# Patient Record
Sex: Male | Born: 1965 | Race: White | Hispanic: No | Marital: Single | Smoking: Never smoker
Health system: Southern US, Community
[De-identification: ages and names within clinical notes are randomized; demographics above are authoritative.]

## PROBLEM LIST (undated history)

## (undated) DIAGNOSIS — F70 Mild intellectual disabilities: Secondary | ICD-10-CM

## (undated) DIAGNOSIS — L219 Seborrheic dermatitis, unspecified: Secondary | ICD-10-CM

## (undated) DIAGNOSIS — G808 Other cerebral palsy: Secondary | ICD-10-CM

## (undated) DIAGNOSIS — M1990 Unspecified osteoarthritis, unspecified site: Secondary | ICD-10-CM

## (undated) HISTORY — DX: Mild intellectual disabilities: F70

## (undated) HISTORY — DX: Unspecified osteoarthritis, unspecified site: M19.90

## (undated) HISTORY — DX: Other cerebral palsy: G80.8

## (undated) HISTORY — DX: Seborrheic dermatitis, unspecified: L21.9

---

## 2003-02-17 ENCOUNTER — Encounter: Payer: Self-pay | Admitting: Internal Medicine

## 2003-02-17 ENCOUNTER — Encounter: Admission: RE | Admit: 2003-02-17 | Discharge: 2003-02-17 | Payer: Self-pay | Admitting: Internal Medicine

## 2003-07-05 ENCOUNTER — Encounter: Admission: RE | Admit: 2003-07-05 | Discharge: 2003-07-05 | Payer: Self-pay | Admitting: Internal Medicine

## 2003-07-05 ENCOUNTER — Encounter: Payer: Self-pay | Admitting: Internal Medicine

## 2003-09-08 ENCOUNTER — Encounter: Admission: RE | Admit: 2003-09-08 | Discharge: 2003-12-07 | Payer: Self-pay | Admitting: Internal Medicine

## 2004-09-05 ENCOUNTER — Encounter: Admission: RE | Admit: 2004-09-05 | Discharge: 2004-10-03 | Payer: Self-pay | Admitting: Internal Medicine

## 2005-10-14 ENCOUNTER — Encounter: Admission: RE | Admit: 2005-10-14 | Discharge: 2006-01-12 | Payer: Self-pay | Admitting: Internal Medicine

## 2006-01-13 ENCOUNTER — Encounter: Admission: RE | Admit: 2006-01-13 | Discharge: 2006-02-09 | Payer: Self-pay | Admitting: Internal Medicine

## 2006-02-10 ENCOUNTER — Encounter: Admission: RE | Admit: 2006-02-10 | Discharge: 2006-05-11 | Payer: Self-pay | Admitting: Internal Medicine

## 2007-07-13 ENCOUNTER — Encounter: Admission: RE | Admit: 2007-07-13 | Discharge: 2007-10-11 | Payer: Self-pay | Admitting: Neurosurgery

## 2007-09-16 ENCOUNTER — Encounter: Admission: RE | Admit: 2007-09-16 | Discharge: 2007-09-16 | Payer: Self-pay | Admitting: Internal Medicine

## 2009-05-01 ENCOUNTER — Encounter: Admission: RE | Admit: 2009-05-01 | Discharge: 2009-05-16 | Payer: Self-pay | Admitting: Internal Medicine

## 2009-07-13 ENCOUNTER — Ambulatory Visit: Payer: Self-pay | Admitting: Internal Medicine

## 2009-09-04 ENCOUNTER — Ambulatory Visit: Payer: Self-pay | Admitting: Internal Medicine

## 2009-09-14 ENCOUNTER — Encounter: Admission: RE | Admit: 2009-09-14 | Discharge: 2009-09-14 | Payer: Self-pay | Admitting: Internal Medicine

## 2009-09-19 ENCOUNTER — Ambulatory Visit: Payer: Self-pay | Admitting: Internal Medicine

## 2009-11-14 ENCOUNTER — Ambulatory Visit: Payer: Self-pay | Admitting: Internal Medicine

## 2010-01-18 ENCOUNTER — Ambulatory Visit: Payer: Self-pay | Admitting: Internal Medicine

## 2010-05-10 ENCOUNTER — Ambulatory Visit: Payer: Self-pay | Admitting: Internal Medicine

## 2010-05-31 ENCOUNTER — Ambulatory Visit: Payer: Self-pay | Admitting: Internal Medicine

## 2010-06-05 ENCOUNTER — Encounter: Admission: RE | Admit: 2010-06-05 | Discharge: 2010-06-05 | Payer: Self-pay | Admitting: Internal Medicine

## 2010-06-05 ENCOUNTER — Ambulatory Visit: Payer: Self-pay | Admitting: Internal Medicine

## 2010-06-26 ENCOUNTER — Ambulatory Visit: Payer: Self-pay | Admitting: Internal Medicine

## 2010-07-11 ENCOUNTER — Ambulatory Visit: Payer: Self-pay | Admitting: Internal Medicine

## 2010-09-27 ENCOUNTER — Ambulatory Visit: Payer: Self-pay | Admitting: Internal Medicine

## 2011-06-12 DIAGNOSIS — G808 Other cerebral palsy: Secondary | ICD-10-CM

## 2011-06-21 ENCOUNTER — Encounter: Payer: Self-pay | Admitting: Internal Medicine

## 2011-07-18 ENCOUNTER — Ambulatory Visit (INDEPENDENT_AMBULATORY_CARE_PROVIDER_SITE_OTHER): Payer: Self-pay | Admitting: Internal Medicine

## 2011-07-18 ENCOUNTER — Encounter: Payer: Self-pay | Admitting: Internal Medicine

## 2011-07-18 VITALS — BP 104/68 | HR 70 | Temp 97.5°F

## 2011-07-18 DIAGNOSIS — M4800 Spinal stenosis, site unspecified: Secondary | ICD-10-CM

## 2011-07-18 DIAGNOSIS — R4789 Other speech disturbances: Secondary | ICD-10-CM

## 2011-07-18 DIAGNOSIS — L219 Seborrheic dermatitis, unspecified: Secondary | ICD-10-CM

## 2011-07-18 DIAGNOSIS — G808 Other cerebral palsy: Secondary | ICD-10-CM

## 2011-07-18 DIAGNOSIS — M4712 Other spondylosis with myelopathy, cervical region: Secondary | ICD-10-CM

## 2011-07-18 DIAGNOSIS — H9190 Unspecified hearing loss, unspecified ear: Secondary | ICD-10-CM

## 2011-07-18 DIAGNOSIS — R479 Unspecified speech disturbances: Secondary | ICD-10-CM

## 2011-07-18 DIAGNOSIS — G959 Disease of spinal cord, unspecified: Secondary | ICD-10-CM

## 2011-07-18 DIAGNOSIS — F79 Unspecified intellectual disabilities: Secondary | ICD-10-CM

## 2011-07-18 DIAGNOSIS — Z0000 Encounter for general adult medical examination without abnormal findings: Secondary | ICD-10-CM

## 2011-07-18 LAB — CBC WITH DIFFERENTIAL/PLATELET
Basophils Absolute: 0 10*3/uL (ref 0.0–0.1)
Basophils Relative: 0 % (ref 0–1)
Eosinophils Absolute: 0.4 10*3/uL (ref 0.0–0.7)
Eosinophils Relative: 4 % (ref 0–5)
HCT: 50.3 % (ref 39.0–52.0)
Hemoglobin: 16.6 g/dL (ref 13.0–17.0)
MCH: 32.8 pg (ref 26.0–34.0)
MCHC: 33 g/dL (ref 30.0–36.0)
MCV: 99.4 fL (ref 78.0–100.0)
Monocytes Absolute: 0.5 10*3/uL (ref 0.1–1.0)
Monocytes Relative: 6 % (ref 3–12)
RDW: 13.9 % (ref 11.5–15.5)

## 2011-07-18 LAB — COMPREHENSIVE METABOLIC PANEL
ALT: 13 U/L (ref 0–53)
AST: 15 U/L (ref 0–37)
Calcium: 9.6 mg/dL (ref 8.4–10.5)
Chloride: 100 mEq/L (ref 96–112)
Creat: 0.77 mg/dL (ref 0.50–1.35)
Sodium: 142 mEq/L (ref 135–145)
Total Protein: 7 g/dL (ref 6.0–8.3)

## 2011-07-18 LAB — POCT URINALYSIS DIPSTICK
Bilirubin, UA: NEGATIVE
Blood, UA: NEGATIVE
Ketones, UA: NEGATIVE
Nitrite, UA: NEGATIVE
Protein, UA: NEGATIVE
pH, UA: 6

## 2011-07-18 LAB — LIPID PANEL
LDL Cholesterol: 99 mg/dL (ref 0–99)
Triglycerides: 124 mg/dL (ref <150)
VLDL: 25 mg/dL (ref 0–40)

## 2011-07-18 NOTE — Progress Notes (Signed)
  Subjective:    Patient ID: Dennis Walsh, male    DOB: 02/12/1966, 45 y.o.   MRN: [id number]  HPI  45 year old white male with history of cerebral palsy with spastic quadriplegia who lives in a group home with history of hearing loss both the ears, facial seborrhea, osteoarthritis of the knees, mild mental retardation. Patient had chickenpox 1999. No known drug allergies.  History of adhesive capsulitis right shoulder 2009. In September 2010 he had lengthening of the biceps tendon with lengthening of the brachioradialis on the right. He had excision of his distal clavicle on the right and Keim denervation of the right upper extremity performed by orthopedics at [facility]. Patient developed tachycardia and hypoxia in the postoperative period and was thought to perhaps have aspirated. Chest x-ray showed bibasilar opacities. CT was negative for pulmonary embolism and he was treated with antibiotics and improved. 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  He has never lived independently. Cerebral palsy was thought to be secondary to Rh incompatibility and complications at birth partially independent with feeding and dressing skills.  His father is his guardian and his mother is his power of attorney. They are divorced. He has no siblings. Does not smoke use alcohol or illicit drugs.  Father with history of GE reflux and anxiety. Maternal grandfather with history of heart disease diabetes and Parkinson's disease. Mother with hypertension. Patient ambulates with a motorized reclining vehicle.  In 2007 he had an MRI under sedation of both the C-spine and LS-spine. 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 Cervical cord  appeared to be significantly atrophied throughout due to spinal stenosis. He had an MRI of his brain at the same time showing no intracranial abnormalities other than an area of T2 hyperintensity in the right cerebral hemisphere felt to be related to a remote lacunar infarction.    Review of Systems appetite is good, affect is mostly pleasant and he and gages with others. He enjoys the computer and watching basketball on television     Objective:   Physical Exam he is difficult to examine. His chest is clear; cardiac exam: regular rate and rhythm, abdomen: no masses, no organomegaly. Deep tendon reflexes 3+ and symmetrical. Significant spasticity in all extremities. Has choreoathetoid movements of upper extremities has left lateral gaze nystagmus. PERRLA. Restricted up and down gaze bilaterally. Pharynx is clear. Speech is bradycardia phrenic and words are very dysarthric. He does not speak in complete sentences. He can follow commands.        Assessment & Plan:  Cerebral palsy with spastic quadriplegia  History of severe cervical spinal stenosis with myelopathy status post fusion C2-T1 with decompression  Bilateral hearing loss  Mild mental retardation  Osteoarthritis of the knees  History of right shoulder adhesive capsulitis  Seborrhea of face  Return one year/when necessary. Forms completed for [employer]/group home and orders reviewed. Had pneumonia vaccine July 2010, gets annual influenza immunization,Tdap given 2010.

## 2011-08-16 DIAGNOSIS — M4800 Spinal stenosis, site unspecified: Secondary | ICD-10-CM | POA: Insufficient documentation

## 2011-08-16 DIAGNOSIS — F79 Unspecified intellectual disabilities: Secondary | ICD-10-CM | POA: Insufficient documentation

## 2011-08-16 DIAGNOSIS — H9190 Unspecified hearing loss, unspecified ear: Secondary | ICD-10-CM | POA: Insufficient documentation

## 2011-08-16 DIAGNOSIS — G808 Other cerebral palsy: Secondary | ICD-10-CM | POA: Insufficient documentation

## 2011-08-16 DIAGNOSIS — G959 Disease of spinal cord, unspecified: Secondary | ICD-10-CM | POA: Insufficient documentation

## 2011-08-16 DIAGNOSIS — R479 Unspecified speech disturbances: Secondary | ICD-10-CM | POA: Insufficient documentation

## 2011-08-16 DIAGNOSIS — L219 Seborrheic dermatitis, unspecified: Secondary | ICD-10-CM | POA: Insufficient documentation

## 2012-02-04 DIAGNOSIS — G808 Other cerebral palsy: Secondary | ICD-10-CM

## 2012-09-25 ENCOUNTER — Other Ambulatory Visit: Payer: Self-pay | Admitting: Internal Medicine

## 2012-09-28 ENCOUNTER — Other Ambulatory Visit (INDEPENDENT_AMBULATORY_CARE_PROVIDER_SITE_OTHER): Payer: Self-pay | Admitting: Internal Medicine

## 2012-09-28 ENCOUNTER — Ambulatory Visit (INDEPENDENT_AMBULATORY_CARE_PROVIDER_SITE_OTHER): Payer: Medicare Other | Admitting: Internal Medicine

## 2012-09-28 VITALS — BP 110/70 | HR 80 | Temp 98.0°F

## 2012-09-28 DIAGNOSIS — G809 Cerebral palsy, unspecified: Secondary | ICD-10-CM | POA: Diagnosis not present

## 2012-09-28 DIAGNOSIS — H9190 Unspecified hearing loss, unspecified ear: Secondary | ICD-10-CM | POA: Diagnosis not present

## 2012-09-28 DIAGNOSIS — Z23 Encounter for immunization: Secondary | ICD-10-CM

## 2012-09-28 DIAGNOSIS — L219 Seborrheic dermatitis, unspecified: Secondary | ICD-10-CM

## 2012-09-28 DIAGNOSIS — Z13220 Encounter for screening for lipoid disorders: Secondary | ICD-10-CM | POA: Diagnosis not present

## 2012-09-28 DIAGNOSIS — G808 Other cerebral palsy: Secondary | ICD-10-CM | POA: Diagnosis not present

## 2012-09-28 DIAGNOSIS — F79 Unspecified intellectual disabilities: Secondary | ICD-10-CM

## 2012-09-28 DIAGNOSIS — G8250 Quadriplegia, unspecified: Secondary | ICD-10-CM | POA: Diagnosis not present

## 2012-09-28 DIAGNOSIS — M7918 Myalgia, other site: Secondary | ICD-10-CM

## 2012-09-28 DIAGNOSIS — IMO0001 Reserved for inherently not codable concepts without codable children: Secondary | ICD-10-CM

## 2012-09-28 LAB — CBC WITH DIFFERENTIAL/PLATELET
Basophils Relative: 0 % (ref 0–1)
Eosinophils Absolute: 0.4 10*3/uL (ref 0.0–0.7)
HCT: 46 % (ref 39.0–52.0)
Hemoglobin: 16.2 g/dL (ref 13.0–17.0)
MCH: 32.3 pg (ref 26.0–34.0)
MCHC: 35.2 g/dL (ref 30.0–36.0)
Monocytes Absolute: 0.5 10*3/uL (ref 0.1–1.0)
Monocytes Relative: 7 % (ref 3–12)

## 2012-09-28 LAB — LIPID PANEL
HDL: 35 mg/dL — ABNORMAL LOW (ref >39)
LDL Cholesterol: 102 mg/dL — ABNORMAL HIGH (ref 0–99)
Triglycerides: 90 mg/dL (ref <150)
VLDL: 18 mg/dL (ref 0–40)

## 2012-09-28 LAB — COMPREHENSIVE METABOLIC PANEL
Alkaline Phosphatase: 67 U/L (ref 39–117)
BUN: 18 mg/dL (ref 6–23)
Creat: 0.72 mg/dL (ref 0.50–1.35)
Glucose, Bld: 89 mg/dL (ref 70–99)
Total Bilirubin: 0.7 mg/dL (ref 0.3–1.2)

## 2012-09-28 NOTE — Progress Notes (Signed)
Subjective:    Patient ID: Dennis Walsh, male    DOB: 07/01/1966, 46 y.o.   MRN: [id number]  HPI 46 year old white male with history of cerebral palsy, mental retardation, bilateral hearing loss, seborrhea, speech impediment, spastic quadriplegia, musculoskeletal pain in today for health maintenance and evaluation of medical problems. Accompanied by his father and attendant from group home where he resides. Occasionally has some knee pain. Is on Naprosyn for that. No significant change in his health over the past year. He is on a stool softener and sometimes goes 2 or 3 days before having a bowel movement. Appetite is good according to attendant. Influenza immunization given today in office.  No known drug allergies. Adhesive capsulitis right shoulder 2009. September 2010 he had lengthening of the biceps tendon with lengthening of the brachioradialis on the right. He had excision of his distal clavicle on the right and denervation of the right upper extremity performed by orthopedics at [facility]. He developed tachycardia and hypoxia in the postoperative period and was thought to perhaps aspirated. Chest x-ray showed bibasilar opacities. CT was negative for pulmonary embolism and he was treated with antibiotics and improved. History of spinal stenosis of the cervical spine with decompression and fusion from C2-T1 T2 cervical spondylosis with myelopathy April 2008.  He has never lived independently. 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 He is partially independent with feeding and dressing skills.  His father is his guardian and his mother is his power of attorney. They are divorced. He has no siblings. Does not smoke, use alcohol or illicit drugs. Resides in group home on [address].  Father with history of GE reflux and anxiety. Maternal grandfather with history of heart disease, diabetes, and Parkinson's disease.  Mother with hypertension.  Patient ambulates in a motorized reclining vehicle.  In 2007 he had an MRI under sedation of both the C-spine and LS-spine. In addition to cervical spinal stenosis he has mild central lumbar canal stenosis at L4-L5 and L5-S1 with bilateral nondisplaced pars defect at L5 with minimal anterior leukostasis L5-S1. Cervical cord appears to be significantly atrophied throughout due to spinal stenosis. He had an MRI of his brain at the same time showing no intracranial abnormalities other than an area of T2 hyperintensity in the right cerebral hemisphere felt to be related to a remote lacunar infarction.  His appetite is good, affect is mostly pleasant, he is in gauging with others. He enjoys the computer and watching basketball on television.    Review of Systems  Constitutional: Negative.   Gastrointestinal:       History of constipation  Musculoskeletal:       History of joint pain  All other systems reviewed and are negative.       Objective:   Physical Exam  Vitals reviewed. Constitutional: He appears well-developed and well-nourished.  HENT:  Head: Normocephalic.  Right Ear: External ear normal.  Left Ear: External ear normal.  Mouth/Throat: Oropharynx is clear and moist.       Wears hearing aids  Eyes: Conjunctivae normal are normal. Pupils are equal, round, and reactive to light. Right eye exhibits no discharge. Left eye exhibits no discharge. No scleral icterus.  Neck: Neck supple. No thyromegaly present.  Cardiovascular: Normal rate and normal heart sounds.   No murmur heard. Pulmonary/Chest: Breath sounds normal. He has no wheezes. He has no rales.  Abdominal: Soft. Bowel sounds are normal. He exhibits no mass.  Genitourinary:       Not examined  Musculoskeletal:       Spastic quadriplegia  Lymphadenopathy:    He has no cervical adenopathy.  Neurological: He is alert.       Deep tendon reflexes hyperreactive  Skin: Skin is warm and dry. He  is not diaphoretic.        Seborrhea on face  Psychiatric:       He is alert. Affect is mostly pleasant.          Assessment & Plan:  Cerebral palsy with spastic quadriplegia  History of severe cervical spinal stenosis with myelopathy status post fusion C2-T1 with decompression  Bilateral hearing loss  Mild mental retardation  Osteoarthritis of the knees  History of right shoulder adhesive capsulitis  Seborrhea of the face  Constipation  Plan: Return in one year or as needed. Forms completed for [employer], group home, and [facility]. He had pneumonia vaccine July 2010 and gets annual influenza immunization. Had Tdap 2010

## 2012-10-11 ENCOUNTER — Encounter: Payer: Self-pay | Admitting: Internal Medicine

## 2012-10-11 NOTE — Patient Instructions (Addendum)
Continue same meds and return in one year. 

## 2013-04-23 ENCOUNTER — Ambulatory Visit (INDEPENDENT_AMBULATORY_CARE_PROVIDER_SITE_OTHER): Payer: Medicare Other | Admitting: Internal Medicine

## 2013-04-23 ENCOUNTER — Encounter: Payer: Self-pay | Admitting: Internal Medicine

## 2013-04-23 VITALS — BP 120/90 | HR 92 | Temp 99.0°F

## 2013-04-23 DIAGNOSIS — G8250 Quadriplegia, unspecified: Secondary | ICD-10-CM | POA: Diagnosis not present

## 2013-04-23 DIAGNOSIS — L219 Seborrheic dermatitis, unspecified: Secondary | ICD-10-CM

## 2013-04-23 DIAGNOSIS — H6123 Impacted cerumen, bilateral: Secondary | ICD-10-CM

## 2013-04-23 DIAGNOSIS — H6120 Impacted cerumen, unspecified ear: Secondary | ICD-10-CM

## 2013-04-23 DIAGNOSIS — J069 Acute upper respiratory infection, unspecified: Secondary | ICD-10-CM

## 2013-04-23 NOTE — Patient Instructions (Addendum)
Take Levaquin 500 milligrams daily for 7 days. Call if not better in 7 days or sooner if worse. Stop NyQuil. Continue Robitussin-DM.

## 2013-04-23 NOTE — Progress Notes (Signed)
  Subjective:    Patient ID: Dennis Walsh, male    DOB: June 22, 1966, 47 y.o.   MRN: [id number]  HPI Brought in today by caretaker from group home. Has upper respiratory infection with cough and congestion. No documented fever. They have been giving him NyQuil and Robitussin-DM. They feel like he is not getting better. Onset of symptoms is vague by their history. It was perhaps as early as May 3 or as late as May 6. No documented fever. Says ears hurt.    Review of Systems     Objective:   Physical Exam Skin is warm and dry. Has seborrhea of face. TMs obscured by cerumen. Pharynx is clear without exudate. Neck is supple without significant adenopathy. Chest: Has left lower lobe rhonchi that clears with cough. No rales.        Assessment & Plan:  Acute URI  Spastic quadriplegia  Cerebral palsy  Seborrhea of face  Cerumen of the ears  Plan: Levaquin 500 milligrams daily for 7 days. Discontinue NyQuil as I believe it is elevating his blood pressure. Continue Robitussin-DM for cough. Call if not better in 7 days or sooner if worse.

## 2013-05-25 ENCOUNTER — Telehealth: Payer: Self-pay | Admitting: Internal Medicine

## 2013-05-25 ENCOUNTER — Other Ambulatory Visit: Payer: Self-pay | Admitting: *Deleted

## 2013-05-25 DIAGNOSIS — E611 Iron deficiency: Secondary | ICD-10-CM

## 2013-05-25 DIAGNOSIS — G8250 Quadriplegia, unspecified: Secondary | ICD-10-CM

## 2013-05-25 MED ORDER — FERROUS SULFATE 325 (65 FE) MG PO TABS: 325.0000 mg | ORAL_TABLET | Freq: Every day | ORAL | Status: DC

## 2013-05-25 MED ORDER — MINOCYCLINE HCL 100 MG PO CAPS: ORAL_CAPSULE | ORAL | Status: DC

## 2013-05-25 NOTE — Telephone Encounter (Signed)
Received request to review medications and fax six-month drug review list back to [facility] at [phone].  The following changes will be made. Ferrous sulfate will be changed to once daily instead of twice daily. Minocycline will be changed from one capsule twice a day to one capsule daily.

## 2013-10-14 ENCOUNTER — Other Ambulatory Visit: Payer: Medicare Other | Admitting: Internal Medicine

## 2013-10-14 DIAGNOSIS — Z0000 Encounter for general adult medical examination without abnormal findings: Secondary | ICD-10-CM

## 2013-10-14 DIAGNOSIS — Z13220 Encounter for screening for lipoid disorders: Secondary | ICD-10-CM

## 2013-10-14 DIAGNOSIS — Z130 Encounter for screening for diseases of the blood and blood-forming organs and certain disorders involving the immune mechanism: Secondary | ICD-10-CM

## 2013-10-14 DIAGNOSIS — D649 Anemia, unspecified: Secondary | ICD-10-CM | POA: Diagnosis not present

## 2013-10-14 DIAGNOSIS — Z125 Encounter for screening for malignant neoplasm of prostate: Secondary | ICD-10-CM

## 2013-10-14 LAB — COMPREHENSIVE METABOLIC PANEL
ALT: 31 U/L (ref 0–53)
CO2: 29 mEq/L (ref 19–32)
Chloride: 103 mEq/L (ref 96–112)
Sodium: 140 mEq/L (ref 135–145)
Total Bilirubin: 0.6 mg/dL (ref 0.3–1.2)
Total Protein: 6.3 g/dL (ref 6.0–8.3)

## 2013-10-14 LAB — CBC WITH DIFFERENTIAL/PLATELET
Basophils Absolute: 0 10*3/uL (ref 0.0–0.1)
Basophils Relative: 0 % (ref 0–1)
Eosinophils Absolute: 0.3 10*3/uL (ref 0.0–0.7)
Eosinophils Relative: 4 % (ref 0–5)
Lymphocytes Relative: 38 % (ref 12–46)
MCV: 94.7 fL (ref 78.0–100.0)
Platelets: 196 10*3/uL (ref 150–400)
RDW: 13.8 % (ref 11.5–15.5)
WBC: 7.8 10*3/uL (ref 4.0–10.5)

## 2013-10-14 LAB — LIPID PANEL: HDL: 33 mg/dL — ABNORMAL LOW (ref >39)

## 2013-10-15 ENCOUNTER — Encounter: Payer: Self-pay | Admitting: Internal Medicine

## 2013-10-15 ENCOUNTER — Ambulatory Visit (INDEPENDENT_AMBULATORY_CARE_PROVIDER_SITE_OTHER): Payer: Medicare Other | Admitting: Internal Medicine

## 2013-10-15 VITALS — BP 122/82 | HR 76 | Temp 97.6°F

## 2013-10-15 DIAGNOSIS — G8250 Quadriplegia, unspecified: Secondary | ICD-10-CM | POA: Diagnosis not present

## 2013-10-15 DIAGNOSIS — H9193 Unspecified hearing loss, bilateral: Secondary | ICD-10-CM

## 2013-10-15 DIAGNOSIS — Z23 Encounter for immunization: Secondary | ICD-10-CM | POA: Diagnosis not present

## 2013-10-15 DIAGNOSIS — H9190 Unspecified hearing loss, unspecified ear: Secondary | ICD-10-CM

## 2013-10-15 DIAGNOSIS — R4789 Other speech disturbances: Secondary | ICD-10-CM | POA: Diagnosis not present

## 2013-10-15 DIAGNOSIS — G808 Other cerebral palsy: Secondary | ICD-10-CM | POA: Diagnosis not present

## 2013-10-15 DIAGNOSIS — L219 Seborrheic dermatitis, unspecified: Secondary | ICD-10-CM

## 2013-10-15 DIAGNOSIS — R479 Unspecified speech disturbances: Secondary | ICD-10-CM

## 2013-10-15 DIAGNOSIS — L218 Other seborrheic dermatitis: Secondary | ICD-10-CM

## 2013-10-15 NOTE — Progress Notes (Signed)
Subjective:    Patient ID: Dennis Walsh, male    DOB: September 25, 1966, 47 y.o.   MRN: [id number]  HPI  47 year old white male with history of cerebral palsy with spastic quadriplegia who lives in a group home in today for health maintenance of a whitish of medical issues. He has history of bilateral hearing loss, seborrhea, speech impediment. History of musculoskeletal pain. He is accompanied by his father and an attendant from the group home where he resides.  No significant change in his health over the past year. Appetite is good.  Influenza immunization given today in office. Tetanus immunization given in 2010. Pneumovax 2010.  No known drug allergies.  Adhesive capsulitis right shoulder 2009.   In September 2010 he had lengthening of the biceps tendon with lengthening of the brachioradialis on the right. He had excision of his distal clavicle on the right and denervation of right upper extremity performed by orthopedist at [facility]. He developed tachycardia and hypoxia in the postoperative period and was thought to have aspirated. Chest x-ray showed bibasilar opacities. CT was negative for pulmonary embolism. He was treated with antibiotics and improved. 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  He has never lived independently. 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 He is partially independent with feeding and dressing skills.  His father is his guardian and his mother has power of attorney. They are divorced. He has no siblings. Does not smoke use alcohol or illicit drugs. Resides in group home on [address].  Family history: Father with history of GE reflux and anxiety. Maternal grandfather with history of heart disease, diabetes, Parkinson's disease. Mother with hypertension. Paternal grandfather died with  congestive heart failure. Paternal grandmother with history of stroke.  Patient ambulates in a motorized reclining vehicle.  His appetite is good. Affect is mostly pleasant but he is difficult to understand. He engages with others and enjoys watching basketball on television.     Review of Systems  Constitutional: Negative.   HENT:       Hearing loss  Eyes: Negative.   Respiratory: Negative.   Cardiovascular: Negative.   Gastrointestinal:       History of constipation  Endocrine: Negative.   Genitourinary: Negative.   Neurological: Positive for speech difficulty.       Vasovagal syncope with blood drawing. Spaticity all extremities  Psychiatric/Behavioral: Negative for agitation.       Objective:   Physical Exam  Vitals reviewed. Constitutional: He appears well-developed and well-nourished. No distress.  HENT:  Head: Normocephalic and atraumatic.  Mouth/Throat: Oropharynx is clear and moist. No oropharyngeal exudate.  Bilateral cerumen  Eyes: Conjunctivae are normal. Right eye exhibits no discharge. Left eye exhibits no discharge. No scleral icterus.  Neck: Neck supple. No JVD present. No thyromegaly present.  Cardiovascular: Normal rate, regular rhythm and normal heart sounds.   No murmur heard. Pulmonary/Chest: Effort normal and breath sounds normal. No respiratory distress.  Abdominal: Soft. Bowel sounds are normal. He exhibits no distension and no mass. There is no tenderness. There is no guarding.  Genitourinary:  Rectum and penis not examined  Musculoskeletal: He exhibits no edema.  Lymphadenopathy:    He has no cervical adenopathy.  Neurological:   Speech impediment. Bilateral hearing loss. Spastic quadriplegia.  Skin: He is not diaphoretic.  Seborrhea of face  Psychiatric: He has a normal  mood and affect. His behavior is normal.          Assessment & Plan:  Cerebral palsy with spastic quadriplegia  History of severe cervical spinal stenosis with  myelopathy status post fusion C2-T1 with decompression  Bilateral hearing loss  Mild mental retardation  Speech impediment  Osteoarthritis of the knees  History of right shoulder adhesive capsulitis  Seborrhea of the face  History of constipation  Plan: Return in one year or as needed. Forms completed for [employer], group home, and fell to. He had Pneumovax July 2010 and gets annual influenza immunization.  Subjective:   Patient presents for Medicare Annual/Subsequent preventive examination.   Review Past Medical/Family/Social:   Risk Factors  Current exercise habits: -None Dietary issues discussed: Is a bit overweight  Cardiac risk factors: Obesity and sedentary lifestyle because confined to wheelchair  Depression Screen  (Note: if answer to either of the following is "Yes", a more complete depression screening is indicated)   Over the past two weeks, have you felt down, depressed or hopeless? No  Over the past two weeks, have you felt little interest or pleasure in doing things? No Have you lost interest or pleasure in daily life? No Do you often feel hopeless? Cannot really assess due to mild mental retardation Do you cry easily over simple problems? Sometimes cries  Activities of Daily Living  In your present state of health, do you have any difficulty performing the following activities?:   Driving? -Does not drive Money?-Does not manage his own affairs   Feeding yourself? No  Getting from bed to chair? yes Climbing a flight of stairs? yes confined to wheelchair Preparing food and eating?:  lives in group home Bathing or showering?  requires assistance Getting dressed:  require some assistance Getting to the toilet?  requires assistance Using the toilet:No  Moving around from place to place: No  In the past year have you fallen or had a near fall?:No  Are you sexually active? No  Do you have more than one partner? No   Hearing Difficulties: No   Do you often ask people to speak up or repeat themselves? yes does not like wearing hearing aids Do you experience ringing or noises in your ears? n/a Do you have difficulty understanding soft or whispered voices? yes Do you feel that you have a problem with memory? n/a Do you often misplace items? n/a   Home Safety: Lives in a group home Do you have a smoke alarm at your residence? Yes Do you have grab bars in the bathroom?yes Do you have throw rugs in your house? N?A   Cognitive Testing  Alert? Yes Normal Appearance?Yes  Oriented to person? Yes Place? Yes  Time?  Recall of three objects? Unable to cooperate with this section because of mild mental retardation Can perform simple calculations?  Displays appropriate judgment? Can read the correct time from a watch face  List the Names of Other Physician/Practitioners you currently use:  See referral list for the physicians patient is currently seeing.     Review of Systems: See above   Objective:     General appearance: Appears stated age and mildly obese  Head: Normocephalic, without obvious abnormality, atraumatic  Eyes: conj clear, EOMi PEERLA  Ears: Bilateral cerumen Nose: Nares normal. Septum midline. Mucosa normal. No drainage or sinus tenderness.  Throat: lips, mucosa, and tongue normal; teeth and gums normal  Neck: no adenopathy, no carotid bruit, no JVD, supple, symmetrical, trachea midline  and thyroid not enlarged, symmetric, no tenderness/mass/nodules  No CVA tenderness.  Lungs: clear to auscultation bilaterally   Heart: regular rate and rhythm, S1, S2 normal, no murmur, click, rub or gallop  Abdomen: soft, non-tender; bowel sounds normal; no masses, no organomegaly  Musculoskeletal: Significant spasticity in all extremities Skin: Skin color normal. Seborrhea of face Lymph nodes: Cervical, supraclavicular, and axillary nodes normal.  Neurologic: Spastic quadriplegia. Speech impediment. Psych:  Mood appear  stable.    Assessment:    Annual wellness medicare exam   Plan:    During the course of the visit the patient was educated and counseled about appropriate screening and preventive services including:   Flu vaccine     Patient Instructions (the written plan) was given to the patient.  Medicare Attestation  I have personally reviewed:  The patient's medical and social history  Their use of alcohol, tobacco or illicit drugs  Their current medications and supplements  The patient's functional ability including ADLs,fall risks, home safety risks, cognitive, and hearing and visual impairment  Diet and physical activities  Evidence for depression or mood disorders  The patient's weight, height, BMI, and visual acuity have been recorded in the chart. I have made referrals, counseling, and provided education to the patient based on review of the above and I have provided the patient with a written personalized care plan for preventive services.

## 2013-10-16 ENCOUNTER — Encounter: Payer: Self-pay | Admitting: Internal Medicine

## 2013-10-16 NOTE — Patient Instructions (Addendum)
Return in one year. Continue same medications. Flu vaccine given.

## 2013-10-29 ENCOUNTER — Other Ambulatory Visit: Payer: Self-pay | Admitting: *Deleted

## 2013-10-29 MED ORDER — MUPIROCIN 2 % EX OINT: TOPICAL_OINTMENT | Freq: Two times a day (BID) | CUTANEOUS | Status: DC

## 2013-11-15 ENCOUNTER — Encounter: Payer: Self-pay | Admitting: Internal Medicine

## 2013-11-15 DIAGNOSIS — G808 Other cerebral palsy: Secondary | ICD-10-CM | POA: Diagnosis not present

## 2013-11-15 DIAGNOSIS — M6281 Muscle weakness (generalized): Secondary | ICD-10-CM | POA: Diagnosis not present

## 2013-11-15 DIAGNOSIS — M4800 Spinal stenosis, site unspecified: Secondary | ICD-10-CM | POA: Diagnosis not present

## 2013-11-15 DIAGNOSIS — R262 Difficulty in walking, not elsewhere classified: Secondary | ICD-10-CM | POA: Diagnosis not present

## 2013-11-15 DIAGNOSIS — IMO0001 Reserved for inherently not codable concepts without codable children: Secondary | ICD-10-CM | POA: Diagnosis not present

## 2013-11-15 DIAGNOSIS — M4712 Other spondylosis with myelopathy, cervical region: Secondary | ICD-10-CM | POA: Diagnosis not present

## 2014-09-12 ENCOUNTER — Other Ambulatory Visit: Payer: Self-pay

## 2014-09-12 DIAGNOSIS — G808 Other cerebral palsy: Secondary | ICD-10-CM

## 2014-09-12 NOTE — Telephone Encounter (Signed)
Order for wheelchair joystick repair placed and faxed to [facility] at [phone].  [phone]

## 2014-11-29 ENCOUNTER — Ambulatory Visit (INDEPENDENT_AMBULATORY_CARE_PROVIDER_SITE_OTHER): Payer: Medicare Other | Admitting: Internal Medicine

## 2014-11-29 ENCOUNTER — Encounter: Payer: Self-pay | Admitting: Internal Medicine

## 2014-11-29 VITALS — BP 100/68 | HR 60 | Temp 97.8°F | Wt 150.0 lb

## 2014-11-29 DIAGNOSIS — Z0000 Encounter for general adult medical examination without abnormal findings: Secondary | ICD-10-CM

## 2014-11-29 DIAGNOSIS — G0439 Other acute necrotizing hemorrhagic encephalopathy: Secondary | ICD-10-CM | POA: Diagnosis not present

## 2014-11-29 DIAGNOSIS — M7918 Myalgia, other site: Secondary | ICD-10-CM

## 2014-11-29 DIAGNOSIS — G8250 Quadriplegia, unspecified: Secondary | ICD-10-CM

## 2014-11-29 DIAGNOSIS — M170 Bilateral primary osteoarthritis of knee: Secondary | ICD-10-CM | POA: Diagnosis not present

## 2014-11-29 DIAGNOSIS — L219 Seborrheic dermatitis, unspecified: Secondary | ICD-10-CM

## 2014-11-29 DIAGNOSIS — M791 Myalgia: Secondary | ICD-10-CM

## 2014-11-29 DIAGNOSIS — R479 Unspecified speech disturbances: Secondary | ICD-10-CM

## 2014-11-29 DIAGNOSIS — H9193 Unspecified hearing loss, bilateral: Secondary | ICD-10-CM

## 2014-11-29 DIAGNOSIS — Z981 Arthrodesis status: Secondary | ICD-10-CM | POA: Diagnosis not present

## 2014-11-29 DIAGNOSIS — F70 Mild intellectual disabilities: Secondary | ICD-10-CM

## 2014-11-29 DIAGNOSIS — G8929 Other chronic pain: Secondary | ICD-10-CM

## 2014-11-29 DIAGNOSIS — Z23 Encounter for immunization: Secondary | ICD-10-CM

## 2014-11-29 DIAGNOSIS — Z8719 Personal history of other diseases of the digestive system: Secondary | ICD-10-CM | POA: Diagnosis not present

## 2014-11-29 DIAGNOSIS — G8389 Other specified paralytic syndromes: Secondary | ICD-10-CM | POA: Diagnosis not present

## 2014-11-29 DIAGNOSIS — G809 Cerebral palsy, unspecified: Secondary | ICD-10-CM | POA: Diagnosis not present

## 2014-11-29 DIAGNOSIS — G800 Spastic quadriplegic cerebral palsy: Secondary | ICD-10-CM

## 2014-11-29 DIAGNOSIS — F804 Speech and language development delay due to hearing loss: Secondary | ICD-10-CM | POA: Diagnosis not present

## 2014-11-29 LAB — COMPREHENSIVE METABOLIC PANEL
ALK PHOS: 61 U/L (ref 39–117)
ALT: 20 U/L (ref 0–53)
AST: 19 U/L (ref 0–37)
Albumin: 4.5 g/dL (ref 3.5–5.2)
BILIRUBIN TOTAL: 0.6 mg/dL (ref 0.2–1.2)
BUN: 26 mg/dL — ABNORMAL HIGH (ref 6–23)
CO2: 28 meq/L (ref 19–32)
CREATININE: 0.75 mg/dL (ref 0.50–1.35)
Calcium: 9.2 mg/dL (ref 8.4–10.5)
Chloride: 105 mEq/L (ref 96–112)
Glucose, Bld: 91 mg/dL (ref 70–99)
Potassium: 4.5 mEq/L (ref 3.5–5.3)
Sodium: 142 mEq/L (ref 135–145)
Total Protein: 6.6 g/dL (ref 6.0–8.3)

## 2014-11-29 LAB — CBC WITH DIFFERENTIAL/PLATELET
BASOS ABS: 0 10*3/uL (ref 0.0–0.1)
BASOS PCT: 0 % (ref 0–1)
EOS ABS: 0.2 10*3/uL (ref 0.0–0.7)
EOS PCT: 3 % (ref 0–5)
HCT: 48.9 % (ref 39.0–52.0)
Hemoglobin: 16.4 g/dL (ref 13.0–17.0)
Lymphocytes Relative: 37 % (ref 12–46)
Lymphs Abs: 3 10*3/uL (ref 0.7–4.0)
MCH: 32.3 pg (ref 26.0–34.0)
MCHC: 33.5 g/dL (ref 30.0–36.0)
MCV: 96.3 fL (ref 78.0–100.0)
MPV: 13.4 fL — AB (ref 9.4–12.4)
Monocytes Absolute: 0.6 10*3/uL (ref 0.1–1.0)
Monocytes Relative: 7 % (ref 3–12)
NEUTROS PCT: 53 % (ref 43–77)
Neutro Abs: 4.3 10*3/uL (ref 1.7–7.7)
PLATELETS: 188 10*3/uL (ref 150–400)
RBC: 5.08 MIL/uL (ref 4.22–5.81)
RDW: 13.6 % (ref 11.5–15.5)
WBC: 8.1 10*3/uL (ref 4.0–10.5)

## 2014-11-29 LAB — LIPID PANEL
CHOL/HDL RATIO: 4.5 ratio
Cholesterol: 162 mg/dL (ref 0–200)
HDL: 36 mg/dL — AB (ref >39)
LDL CALC: 100 mg/dL — AB (ref 0–99)
Triglycerides: 132 mg/dL (ref <150)
VLDL: 26 mg/dL (ref 0–40)

## 2014-11-29 MED ORDER — PNEUMOCOCCAL 13-VAL CONJ VACC IM SUSP
0.5000 mL | Freq: Once | INTRAMUSCULAR | Status: AC
  Administered 2014-11-29: 0.5 mL via INTRAMUSCULAR

## 2014-11-30 ENCOUNTER — Telehealth: Payer: Self-pay

## 2014-11-30 NOTE — Telephone Encounter (Signed)
Banalg linament is not available.  [facility] would like to know what an equivalent would be.  Please advise.

## 2014-11-30 NOTE — Telephone Encounter (Signed)
Called [facility] cancelled order as instructed by Dr Lenord FellersBaxley

## 2014-11-30 NOTE — Telephone Encounter (Signed)
I was hoping they would know an alternative. That is why I signed generic. If they do not know, cancel order

## 2014-12-13 ENCOUNTER — Telehealth: Payer: Self-pay | Admitting: Internal Medicine

## 2014-12-13 NOTE — Telephone Encounter (Signed)
Pharmacy [facility]) is calling (pharmacist is Foye ClockKristina) to inquire about the OTC linament Banalg.  Instructions were to apply to neck and shoulders.  They do not carry this or the generic equivalent.  Is there something else you want them to use instead?  Apparently the facility is calling or sending another request for this Rx to the pharmacy.  So, they're calling us for clarification.  Please advise.

## 2014-12-14 NOTE — Telephone Encounter (Signed)
Faxed instructions to cancel order since pharmacy could not give an alternative linament suggestion.

## 2015-03-13 NOTE — Patient Instructions (Signed)
Continue same medications and return in one year. Flu vaccine and Prevnar given

## 2015-03-13 NOTE — Progress Notes (Signed)
Subjective:    Patient ID: Dennis Walsh, male    DOB: 02-15-1966, 49 y.o.   MRN: [id number]  HPI  49 year old white male in today for health maintenance exam and evaluation of medical issues. He has a history of cerebral palsy with spastic quadriplegia, seborrhea, musculoskeletal pain, hearing loss. History of speech impediment.  No significant change in his health over the past year. Appetite is good.   No known drug allergies.  Adhesive capsulitis right shoulder 2009.  He has never lived independently. 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 He is partly independent with feeding and dressing skills.  His father is his guardian and his mother has power of attorney. They're divorced. He has no siblings. He does not smoke, use alcohol or illicit drugs. He resides in group home on [address].  He ambulates using a motorized reclining vehicle.  In September 2010 he had lengthening of the biceps tendon with lengthening of the brachioradialis is on the right. He had excision of his distal clavicle on the right and denervation of right upper extremity [facility]. He developed tachycardia and hypoxia in the postoperative period and was thought to have aspirated. Chest x-ray showed bibasilar opacities. CT was negative for pulmonary embolism. He was treated with antibiotics and improved. History of spinal stenosis of the cervical spine with decompression and fusion from C2-T1 for cervical spondylosis with myelopathy and April 2008.  His affect is mostly pleasant but he is difficult to understand. He and gages with others enjoys watching basketball on television.  Family history father with history of GE reflux and anxiety. Maternal grandfather with history of heart disease, diabetes, Parkinson's disease. Mother with hypertension. Paternal grandfather died with congestive heart failure.  Paternal grandmother with history of stroke.    Review of Systems history of constipation, hearing loss, wears hearing aids, has vasovagal syncope with blood drawing. Spasticity with all extremities.     Objective:   Physical Exam  Constitutional: He appears well-developed and well-nourished. No distress.  HENT:  Head: Normocephalic and atraumatic.  Right Ear: External ear normal.  Left Ear: External ear normal.  Mouth/Throat: Oropharynx is clear and moist. No oropharyngeal exudate.  Eyes: Pupils are equal, round, and reactive to light. Right eye exhibits no discharge. Left eye exhibits no discharge. No scleral icterus.  Neck: Neck supple. No JVD present. No thyromegaly present.  Cardiovascular: Normal rate, regular rhythm, normal heart sounds and intact distal pulses.   No murmur heard. Pulmonary/Chest: Effort normal and breath sounds normal. No respiratory distress. He has no wheezes. He has no rales.  Abdominal: Bowel sounds are normal. He exhibits no distension and no mass. There is no tenderness. There is no rebound and no guarding.  Genitourinary:  Not examined  Musculoskeletal:  No lower extremity edema  Lymphadenopathy:    He has no cervical adenopathy.  Neurological: He is alert. No cranial nerve deficit. Coordination abnormal.  Spastic quadriplegia, bilateral hearing loss, speech impediment  Skin: Skin is warm and dry. He is not diaphoretic.  Seborrhea on face  Psychiatric:  Cooperative  Vitals reviewed.         Assessment & Plan:  Cerebral palsy with spastic quadriplegia  History of severe cervical spinal stenosis with myelopathy status post fusion C2-T1 with decompression  Bilateral hearing loss  Mild mental retardation  Speech impediment  Osteoarthritis of the knees  History of right shoulder adhesive capsulitis  Seborrhea of the face  History of constipation  Plan: He had Pneumovax immunization July 2010. Gets annual influenza immunization.  Forms completed for [employer], group home and [facility]. Return in one year or as needed.

## 2016-01-05 ENCOUNTER — Other Ambulatory Visit: Payer: Self-pay | Admitting: Internal Medicine

## 2016-01-11 ENCOUNTER — Other Ambulatory Visit: Payer: Self-pay | Admitting: Internal Medicine

## 2016-04-01 ENCOUNTER — Other Ambulatory Visit: Payer: Self-pay | Admitting: Internal Medicine

## 2016-04-11 ENCOUNTER — Other Ambulatory Visit: Payer: Self-pay | Admitting: Internal Medicine

## 2016-06-10 ENCOUNTER — Other Ambulatory Visit: Payer: Self-pay | Admitting: Internal Medicine

## 2016-10-02 ENCOUNTER — Other Ambulatory Visit: Payer: Self-pay | Admitting: Internal Medicine

## 2017-01-30 ENCOUNTER — Encounter: Payer: Self-pay | Admitting: Internal Medicine

## 2017-01-30 ENCOUNTER — Ambulatory Visit (INDEPENDENT_AMBULATORY_CARE_PROVIDER_SITE_OTHER): Payer: Medicare Other | Admitting: Internal Medicine

## 2017-01-30 VITALS — BP 120/80 | HR 88 | Temp 98.4°F

## 2017-01-30 DIAGNOSIS — H9193 Unspecified hearing loss, bilateral: Secondary | ICD-10-CM | POA: Diagnosis not present

## 2017-01-30 DIAGNOSIS — Z23 Encounter for immunization: Secondary | ICD-10-CM | POA: Diagnosis not present

## 2017-01-30 DIAGNOSIS — Z13220 Encounter for screening for lipoid disorders: Secondary | ICD-10-CM

## 2017-01-30 DIAGNOSIS — Z0000 Encounter for general adult medical examination without abnormal findings: Secondary | ICD-10-CM | POA: Diagnosis not present

## 2017-01-30 DIAGNOSIS — M7918 Myalgia, other site: Secondary | ICD-10-CM

## 2017-01-30 DIAGNOSIS — G800 Spastic quadriplegic cerebral palsy: Secondary | ICD-10-CM | POA: Diagnosis not present

## 2017-01-30 DIAGNOSIS — K5901 Slow transit constipation: Secondary | ICD-10-CM | POA: Diagnosis not present

## 2017-01-30 DIAGNOSIS — M791 Myalgia: Secondary | ICD-10-CM

## 2017-01-30 DIAGNOSIS — Z125 Encounter for screening for malignant neoplasm of prostate: Secondary | ICD-10-CM

## 2017-01-30 DIAGNOSIS — L219 Seborrheic dermatitis, unspecified: Secondary | ICD-10-CM | POA: Diagnosis not present

## 2017-01-30 LAB — LIPID PANEL
CHOL/HDL RATIO: 3.6 ratio (ref <5.0)
CHOLESTEROL: 148 mg/dL (ref <200)
HDL: 41 mg/dL (ref >40)
LDL Cholesterol: 84 mg/dL (ref <100)
TRIGLYCERIDES: 114 mg/dL (ref <150)
VLDL: 23 mg/dL (ref <30)

## 2017-01-30 LAB — CBC WITH DIFFERENTIAL/PLATELET
Basophils Absolute: 87 cells/uL (ref 0–200)
Basophils Relative: 1 %
EOS ABS: 348 {cells}/uL (ref 15–500)
Eosinophils Relative: 4 %
HEMATOCRIT: 47.9 % (ref 38.5–50.0)
Hemoglobin: 16.2 g/dL (ref 13.2–17.1)
LYMPHS PCT: 31 %
Lymphs Abs: 2697 cells/uL (ref 850–3900)
MCH: 32.3 pg (ref 27.0–33.0)
MCHC: 33.8 g/dL (ref 32.0–36.0)
MCV: 95.4 fL (ref 80.0–100.0)
MPV: 13.6 fL — ABNORMAL HIGH (ref 7.5–12.5)
Monocytes Absolute: 522 cells/uL (ref 200–950)
Monocytes Relative: 6 %
NEUTROS PCT: 58 %
Neutro Abs: 5046 cells/uL (ref 1500–7800)
Platelets: 179 10*3/uL (ref 140–400)
RBC: 5.02 MIL/uL (ref 4.20–5.80)
RDW: 14 % (ref 11.0–15.0)
WBC: 8.7 10*3/uL (ref 3.8–10.8)

## 2017-01-30 LAB — POCT URINALYSIS DIPSTICK
GLUCOSE UA: NEGATIVE
KETONES UA: NEGATIVE
LEUKOCYTES UA: NEGATIVE
Nitrite, UA: NEGATIVE
Protein, UA: NEGATIVE
Spec Grav, UA: 1.02
Urobilinogen, UA: NEGATIVE
pH, UA: 6

## 2017-01-30 LAB — PSA: PSA: 0.3 ng/mL (ref <=4.0)

## 2017-01-30 LAB — COMPREHENSIVE METABOLIC PANEL
ALBUMIN: 4.3 g/dL (ref 3.6–5.1)
ALK PHOS: 65 U/L (ref 40–115)
ALT: 15 U/L (ref 9–46)
AST: 17 U/L (ref 10–35)
BUN: 23 mg/dL (ref 7–25)
CALCIUM: 9.2 mg/dL (ref 8.6–10.3)
CO2: 27 mmol/L (ref 20–31)
Chloride: 103 mmol/L (ref 98–110)
Creat: 0.77 mg/dL (ref 0.70–1.33)
Glucose, Bld: 91 mg/dL (ref 65–99)
POTASSIUM: 3.8 mmol/L (ref 3.5–5.3)
Sodium: 143 mmol/L (ref 135–146)
Total Bilirubin: 0.6 mg/dL (ref 0.2–1.2)
Total Protein: 6.8 g/dL (ref 6.1–8.1)

## 2017-01-30 MED ORDER — POLYETHYLENE GLYCOL 3350 17 GM/SCOOP PO POWD: 17.0000 g | Freq: Every day | ORAL | 6 refills | Status: DC

## 2017-01-30 NOTE — Progress Notes (Signed)
   Subjective:    Patient ID: Dennis Walsh, male    DOB: 11/07/1966, 51 y.o.   MRN: [id number]  HPI 50       Review of Systems     Objective:   Physical Exam        Assessment & Plan:

## 2017-01-30 NOTE — Patient Instructions (Addendum)
Dermatology consultation regarding seborrheic dermatitis. Continue same medications. Try MiraLAX daily for constipation. Return in one year or as needed. Flu vaccine given. Consider cologuard for colon cancer screening

## 2017-01-30 NOTE — Progress Notes (Signed)
Subjective:    Patient ID: Dennis BouchardJerry L Walsh, male    DOB: 1966/04/02, 51 y.o.   MRN: [id number]  HPI  51 year old Male with history of cerebral palsy and spastic quadriplegia in today for health maintenance exam and evaluation of medical issues. He is accompanied by caretaker from group home and his mother. His motorized wheelchair is currently being repaired. He's having some right hip discomfort but mother thinks is because this is not his regular wheelchair. It looks like his seborrhea has gotten a bit worse despite using Nizoral cream. We will refer him to dermatologist for evaluation.  He has issues with constipation. He does take fiber daily and MiraLAX when necessary. I think it would be wise to have him take MiraLAX on a regular basis. He has large bowel movements every 3 or 4 days and sometimes a stop up to commode.  He takes inside for musculoskeletal pain. He is on Paxil for agitation. We reviewed his medications with his mother and caretaker today. There really isn't anything that can be discontinued at this point in time.  He was given flu vaccine today.  He has a history of hearing loss and speech impediment.  No significant change in his health according to his mother who accompanies him today. Appetite is good.  No known drug allergies.  Adhesive capsulitis of right shoulder 2009.  He has never lived independently. 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 He is partly independent. Apparently has to be fed because of significant spasticity and decreased range of motion right upper extremity. He is right-handed. Mother says this became an issue after his cervical neck surgery in 2008.  His father is his guardian and his mother has power of attorney. They are divorced. He has no siblings. He does not smoke use alcohol or illicit drugs. He resides in a group home [address]. He ambulates using a motorized reclining vehicle.  He enjoys watching basketball. His affect is pleasant but he is difficult to understand.  In September 2010 he had lengthening of biceps tendon with lengthening of the brachioradialis on the right. He had excision of his distal clavicle on the right and denervation of the right upper extremity performed by orthopedist at [facility]. He developed tachycardia and hypoxia in the postoperative period and was thought to have aspirated. Chest x-ray showed bibasilar opacities. CT was negative for pulmonary embolism. He was treated with antibiotics and improved.  History of spinal stenosis of the cervical spine with decompression and fusion from C2-T1 for cervical spondylosis with myelopathy in April 2008.  Social history: Lives in group home. Parents are divorced but supportive. Does not smoke or consume alcohol.  Family history: Father with history of GE reflux, hypertension and anxiety. Maternal grandfather with history of heart disease, diabetes, Parkinson's disease. Mother with hypertension. Paternal grandfather died of congestive heart failure. Paternal grandmother with history of stroke   Review of Systems constipation, hearing loss, he wears hearing aids. He has vasovagal syncope with blood drawing.     Objective:   Physical Exam  Constitutional: He appears well-developed and well-nourished.  HENT:  Head: Normocephalic and atraumatic.  Right Ear: External ear normal.  Left Ear: External ear normal.  Mouth/Throat: Oropharynx is clear and moist.  Eyes: Conjunctivae and EOM are normal. Pupils are equal, round, and reactive to light. Right eye exhibits no discharge. Left eye exhibits no discharge.  Neck: Neck supple. No  thyromegaly present.  Cardiovascular: Normal rate, regular rhythm and normal heart sounds.   No murmur heard. Pulmonary/Chest: No respiratory distress. He has no wheezes. He has no rales.  Abdominal: Soft. He exhibits distension. He exhibits no mass.  There is no rebound and no guarding.  Abdomen is slightly distended and tympanic. I think he is constipated  Musculoskeletal: He exhibits no edema.  Lymphadenopathy:    He has no cervical adenopathy.  Neurological: He is alert.  Spastic quadriplegia worse in right upper extremity than left upper extremity. He is right-handed.  Skin: Skin is warm and dry. Rash noted. He is not diaphoretic.  Erythematous scaly rash in nasolabial folds bilaterally  Psychiatric:  He is cooperative. Difficult to understand due to speech impediment  Vitals reviewed.         Assessment & Plan:  Spastic quadriplegia and cerebral palsy  Seborrheic dermatitis  Constipation  Musculoskeletal pain  Plan: Recommend MiraLAX on a daily basis. Dermatology consultation regarding seborrheic dermatitis. He's been on Nizoral cream for a long. Of time. Lab work drawn and pending. Flu vaccine given.Return in one year or as needed.  Subjective:   Patient presents for Medicare Annual/Subsequent preventive examination.  Review Past Medical/Family/Social:See above   Risk Factors  Current exercise habits: No exercise-confined to wheelchair Dietary issues discussed: Recommend low-fat low carbohydrate.  Cardiac risk factors:Paternal grandfather died with congestive heart failure. Paternal grandmother with history of stroke. Maternal grandfather with history of heart disease and diabetes  Depression Screen  (Note: if answer to either of the following is "Yes", a more complete depression screening is indicated)   Over the past two weeks, have you felt down, depressed or hopeless? Yes because his motorized wheelchair has been broken Over the past two weeks, have you felt little interest or pleasure in doing things? No Have you lost interest or pleasure in daily life? No Do you often feel hopeless? No Do you cry easily over simple problems? No   Activities of Daily Living  In your present state of health, do you  have any difficulty performing the following activities?:   Driving? Does not drive a vehicle Managing money? Mother reports he likes to spend money Feeding yourself? Yes requires assistance Getting from bed to chair?  Clyesimbing a flight of stairs? Does not climb stairs. Is in a wheelchair. Preparing food and eating?: No  Bathing or showering? No  Getting dressed: Yes Getting to the toilet? Yes Using the toilet: Yes Moving around from place to place: Requires assistance but ambulates with a motorized wheelchair In the past year have you fallen or had a near fall?:No  Are you sexually active? No  Do you have more than one partner? No   Hearing Difficulties: No  Do you often ask people to speak up or repeat themselves? he has history of hearing loss  Do you experience ringing or noises in your ears? No  Do you have difficulty understanding soft or whispered voices? yes history of hearing loss  Do you feel that you have a problem with memory? No Do you often misplace items? No    Home Safety:  Do you have a smoke alarm at your residence? Yes Do you have grab bars in the bathroom? yes  Do you have throw rugs in your house? no    Cognitive Testing  Alert? Yes Normal Appearance?Yes except for spastic quadriplegia Oriented to person? Yes Place? Yes  Time? Not sure Recall of three objects? Not tested Can perform simple  calculations? Not tested Displays appropriate judgment-most of the time Can read the correct time from a watch face- not tested  List the Names of Other Physician/Practitioners you currently use:  See referral list for the physicians patient is currently seeing.     Review of Systems: See above   Objective:     General appearance: Appears stated age and mildly obese  Head: Normocephalic, without obvious abnormality, atraumatic  Eyes: conj clear, EOMi PEERLA  Ears: normal TM's and external ear canals both ears  Nose: Nares normal. Septum midline. Mucosa  normal. No drainage or sinus tenderness.  Throat: lips, mucosa, and tongue normal; teeth and gums normal  Neck: no adenopathy, no carotid bruit, no JVD, supple, symmetrical, trachea midline and thyroid not enlarged, symmetric, no tenderness/mass/nodules  No CVA tenderness.  Lungs: clear to auscultation bilaterally  Breasts: normal appearance, no masses or tenderness Heart: regular rate and rhythm, S1, S2 normal, no murmur, click, rub or gallop  Abdomen: soft, non-tender; bowel sounds normal; no masses, no organomegaly  Musculoskeletal: ROM normal in all joints, no crepitus, no deformity, Normal muscle strengthen. Back  is symmetric, no curvature. Skin: Skin color, texture, turgor normal. No rashes or lesions  Lymph nodes: Cervical, supraclavicular, and axillary nodes normal.  Neurologic: CN 2 -12 Normal, Normal symmetric reflexes. Normal coordination and gait  Psych: Alert & Oriented x 3, Mood appear stable.    Assessment:    Annual wellness medicare exam   Plan:    During the course of the visit the patient was educated and counseled about appropriate screening and preventive services including:   Annual flu vaccine  Discussed cologuard with Mother.Order submitted.     Patient Instructions (the written plan) was given to the patient.  Medicare Attestation  I have personally reviewed:  The patient's medical and social history  Their use of alcohol, tobacco or illicit drugs  Their current medications and supplements  The patient's functional ability including ADLs,fall risks, home safety risks, cognitive, and hearing and visual impairment  Diet and physical activities  Evidence for depression or mood disorders  The patient's weight, height, BMI, and visual acuity have been recorded in the chart. I have made referrals, counseling, and provided education to the patient based on review of the above and I have provided the patient with a written personalized care plan for preventive  services.

## 2017-02-10 DIAGNOSIS — L218 Other seborrheic dermatitis: Secondary | ICD-10-CM | POA: Diagnosis not present

## 2017-03-26 ENCOUNTER — Encounter: Payer: Self-pay | Admitting: Internal Medicine

## 2017-03-26 LAB — COLOGUARD

## 2017-04-14 DIAGNOSIS — Z1211 Encounter for screening for malignant neoplasm of colon: Secondary | ICD-10-CM | POA: Diagnosis not present

## 2017-04-14 DIAGNOSIS — Z1212 Encounter for screening for malignant neoplasm of rectum: Secondary | ICD-10-CM | POA: Diagnosis not present

## 2017-05-27 DIAGNOSIS — Z029 Encounter for administrative examinations, unspecified: Secondary | ICD-10-CM

## 2017-07-16 ENCOUNTER — Other Ambulatory Visit: Payer: Self-pay | Admitting: Internal Medicine

## 2017-09-04 ENCOUNTER — Other Ambulatory Visit: Payer: Self-pay | Admitting: Internal Medicine

## 2017-10-23 ENCOUNTER — Other Ambulatory Visit: Payer: Self-pay | Admitting: Internal Medicine

## 2017-12-15 ENCOUNTER — Other Ambulatory Visit: Payer: Self-pay | Admitting: Internal Medicine

## 2017-12-26 ENCOUNTER — Other Ambulatory Visit: Payer: Self-pay | Admitting: Internal Medicine

## 2018-01-23 ENCOUNTER — Other Ambulatory Visit: Payer: Self-pay

## 2018-01-23 DIAGNOSIS — Z1329 Encounter for screening for other suspected endocrine disorder: Secondary | ICD-10-CM

## 2018-01-23 DIAGNOSIS — Z1321 Encounter for screening for nutritional disorder: Secondary | ICD-10-CM

## 2018-01-23 DIAGNOSIS — E785 Hyperlipidemia, unspecified: Secondary | ICD-10-CM

## 2018-01-23 DIAGNOSIS — Z0000 Encounter for general adult medical examination without abnormal findings: Secondary | ICD-10-CM

## 2018-02-10 ENCOUNTER — Other Ambulatory Visit: Payer: Self-pay | Admitting: Internal Medicine

## 2018-02-20 ENCOUNTER — Other Ambulatory Visit (INDEPENDENT_AMBULATORY_CARE_PROVIDER_SITE_OTHER): Payer: Medicare Other | Admitting: Internal Medicine

## 2018-02-20 DIAGNOSIS — Z0000 Encounter for general adult medical examination without abnormal findings: Secondary | ICD-10-CM

## 2018-02-20 DIAGNOSIS — Z1329 Encounter for screening for other suspected endocrine disorder: Secondary | ICD-10-CM

## 2018-02-20 DIAGNOSIS — E785 Hyperlipidemia, unspecified: Secondary | ICD-10-CM

## 2018-02-20 DIAGNOSIS — Z1321 Encounter for screening for nutritional disorder: Secondary | ICD-10-CM

## 2018-02-20 LAB — POCT URINALYSIS DIPSTICK
Appearance: NORMAL
Bilirubin, UA: NEGATIVE
GLUCOSE UA: NEGATIVE
KETONES UA: NEGATIVE
Leukocytes, UA: NEGATIVE
NITRITE UA: NEGATIVE
ODOR: NORMAL
Spec Grav, UA: 1.025 (ref 1.010–1.025)
Urobilinogen, UA: 0.2 E.U./dL
pH, UA: 6 (ref 5.0–8.0)

## 2018-02-20 NOTE — Addendum Note (Signed)
Addended by: Gregery NaVALENCIA, Kamren Heintzelman P on: 02/20/2018 09:38 AM   Modules accepted: Orders

## 2018-02-21 LAB — COMPLETE METABOLIC PANEL WITH GFR
AG Ratio: 1.9 (calc) (ref 1.0–2.5)
ALBUMIN MSPROF: 4.2 g/dL (ref 3.6–5.1)
ALT: 15 U/L (ref 9–46)
AST: 15 U/L (ref 10–35)
Alkaline phosphatase (APISO): 65 U/L (ref 40–115)
BUN: 25 mg/dL (ref 7–25)
CO2: 30 mmol/L (ref 20–32)
CREATININE: 0.76 mg/dL (ref 0.70–1.33)
Calcium: 9.1 mg/dL (ref 8.6–10.3)
Chloride: 103 mmol/L (ref 98–110)
GFR, EST AFRICAN AMERICAN: 122 mL/min/{1.73_m2} (ref >=60)
GFR, Est Non African American: 106 mL/min/{1.73_m2} (ref >=60)
GLUCOSE: 101 mg/dL — AB (ref 65–99)
Globulin: 2.2 g/dL (calc) (ref 1.9–3.7)
Potassium: 4.5 mmol/L (ref 3.5–5.3)
Sodium: 141 mmol/L (ref 135–146)
TOTAL PROTEIN: 6.4 g/dL (ref 6.1–8.1)
Total Bilirubin: 0.8 mg/dL (ref 0.2–1.2)

## 2018-02-21 LAB — LIPID PANEL
CHOLESTEROL: 146 mg/dL (ref <200)
HDL: 39 mg/dL — ABNORMAL LOW (ref >40)
LDL CHOLESTEROL (CALC): 87 mg/dL
Non-HDL Cholesterol (Calc): 107 mg/dL (calc) (ref <130)
TRIGLYCERIDES: 102 mg/dL (ref <150)
Total CHOL/HDL Ratio: 3.7 (calc) (ref <5.0)

## 2018-02-21 LAB — CBC WITH DIFFERENTIAL/PLATELET
BASOS ABS: 29 {cells}/uL (ref 0–200)
Basophils Relative: 0.3 %
EOS ABS: 228 {cells}/uL (ref 15–500)
Eosinophils Relative: 2.4 %
HEMATOCRIT: 46.4 % (ref 38.5–50.0)
Hemoglobin: 16.1 g/dL (ref 13.2–17.1)
LYMPHS ABS: 3031 {cells}/uL (ref 850–3900)
MCH: 32.6 pg (ref 27.0–33.0)
MCHC: 34.7 g/dL (ref 32.0–36.0)
MCV: 93.9 fL (ref 80.0–100.0)
MPV: 14.2 fL — ABNORMAL HIGH (ref 7.5–12.5)
Monocytes Relative: 7.4 %
NEUTROS PCT: 58 %
Neutro Abs: 5510 cells/uL (ref 1500–7800)
Platelets: 163 10*3/uL (ref 140–400)
RBC: 4.94 10*6/uL (ref 4.20–5.80)
RDW: 12.3 % (ref 11.0–15.0)
TOTAL LYMPHOCYTE: 31.9 %
WBC: 9.5 10*3/uL (ref 3.8–10.8)
WBCMIX: 703 {cells}/uL (ref 200–950)

## 2018-02-21 LAB — TSH: TSH: 1.31 mIU/L (ref 0.40–4.50)

## 2018-02-21 LAB — VITAMIN D 25 HYDROXY (VIT D DEFICIENCY, FRACTURES): Vit D, 25-Hydroxy: 16 ng/mL — ABNORMAL LOW (ref 30–100)

## 2018-02-23 ENCOUNTER — Ambulatory Visit (INDEPENDENT_AMBULATORY_CARE_PROVIDER_SITE_OTHER): Payer: Medicare Other | Admitting: Internal Medicine

## 2018-02-23 ENCOUNTER — Encounter: Payer: Self-pay | Admitting: Internal Medicine

## 2018-02-23 VITALS — BP 110/80 | HR 95 | Temp 98.3°F

## 2018-02-23 DIAGNOSIS — H9193 Unspecified hearing loss, bilateral: Secondary | ICD-10-CM

## 2018-02-23 DIAGNOSIS — L219 Seborrheic dermatitis, unspecified: Secondary | ICD-10-CM

## 2018-02-23 DIAGNOSIS — G800 Spastic quadriplegic cerebral palsy: Secondary | ICD-10-CM

## 2018-02-23 DIAGNOSIS — E559 Vitamin D deficiency, unspecified: Secondary | ICD-10-CM

## 2018-02-23 DIAGNOSIS — Z0000 Encounter for general adult medical examination without abnormal findings: Secondary | ICD-10-CM

## 2018-02-23 DIAGNOSIS — M7918 Myalgia, other site: Secondary | ICD-10-CM

## 2018-02-23 DIAGNOSIS — Z125 Encounter for screening for malignant neoplasm of prostate: Secondary | ICD-10-CM

## 2018-02-24 LAB — PSA: PSA: 0.2 ng/mL (ref <=4.0)

## 2018-03-03 MED ORDER — VITAMIN D 50 MCG (2000 UT) PO TABS: 2000.0000 [IU] | ORAL_TABLET | Freq: Every day | ORAL | 0 refills | Status: DC

## 2018-03-03 NOTE — Progress Notes (Signed)
Subjective:    Patient ID: Dennis Walsh, male    DOB: September 02, 1966, 52 y.o.   MRN: [id number]  HPI 52 year old White Male brought in today by his father for health maintenance exam and evaluation of medical issues.  He has a history of cerebral palsy and spastic quadriplegia and resides in a group home on [address].  He has a motorized wheelchair.  He has some musculoskeletal pain from time to time but basically does fairly well.  He has seborrhea of the face treated with Nizoral cream.  He has issues with constipation.  He takes daily fiber and as needed MiraLAX.  I think he probably should take MiraLAX daily.  He is on Paxil for agitation.  We have kept him on this for a number of years and see no reason to take him off.  It seems to help his affect.  He has a history of hearing loss and speech impediment.  Sometimes he is  difficult to understand.  No significant change in his health according to his father.  His appetite is good.  No known drug allergies.  Adhesive capsulitis right shoulder 2009.  He has never lived independently.  Cerebral palsy was thought to be secondary to an Rh incompatibility and complications of birth he is partially independent.  He has to be fed because of significant spasticity and decreased range of motion in the right upper extremity.  He is right-handed.  Mother says this became an issue after his cervical neck surgery in 2008.  His father is his guardian and his mother has power of attorney.  They are divorced.  He has no siblings.  He does not smoke use alcohol or illicit drugs.  He resides in a group home on [address].  He ambulates using a motorized reclining vehicle.  He enjoys watching TV.  His affect is generally pleasant.  In September 2010, he had lengthening of biceps tendon with lengthening of brachioradialis on the right.  He had excision of his distal clavicle on the right and denervation of the right upper extremity performed by  orthopedist at [facility].  He had tachycardia and hypoxia in the postop period and was thought to have aspirated.  Chest x-ray showed bibasilar opacities.  CT was negative for PE.  He was treated with antibiotics and improved.  History of spinal stenosis of the C-spine with decompression and fusion from C2-T1 for cervical spondylosis with myelopathy April 2008.  Family history: Father with history of GE reflux hypertension and anxiety.  Maternal grandfather with history of heart disease diabetes and Parkinson's disease.  Mother with hypertension.  Paternal grandfather died of congestive heart failure.  Paternal grandmother with history of stroke deceased.      Review of Systems  Gastrointestinal: Positive for constipation.  Musculoskeletal: Positive for arthralgias.  Neurological:       Spastic quadriplegia and speech impediment       Objective:   Physical Exam  Constitutional: He appears well-developed and well-nourished.  HENT:  Head: Normocephalic and atraumatic.  Right Ear: External ear normal.  Left Ear: External ear normal.  Mouth/Throat: Oropharynx is clear and moist.  Eyes: Conjunctivae are normal.  Neck: Neck supple. No JVD present. No thyromegaly present.  Cardiovascular: Normal rate and normal heart sounds.  Abdominal: Soft. Bowel sounds are normal. He exhibits no distension and no mass. There is no tenderness.  Genitourinary:  Genitourinary Comments: Not examined  but PSA is normal  Musculoskeletal: He  exhibits no edema.  Lymphadenopathy:    He has no cervical adenopathy.  Neurological:  Spastic quadriplegia.  Speech  impaired  Skin: Skin is warm and dry.  Seborrhea of the face  Psychiatric:  Cooperative.  Smiles at times.   With difficult to examine due to spastic quadriplegia       Assessment & Plan:   Cerebral palsy with spastic quadriplegia  Bilateral hearing loss  Routine annual exam  Musculoskeletal pain  Seborrheic  dermatitis  History of constipation-treated with MiraLAX  Vitamin D deficiency-he will take 2000 units vitamin D3 daily  Plan: Forms completed for group home as requested.  Return in 1 year or as needed.  Subjective:   Patient presents for Medicare Annual/Subsequent preventive examination.  Review Past Medical/Family/Social:   Risk Factors  Current exercise habits:  Dietary issues discussed:   Cardiac risk factors:  Depression Screen  (Note: if answer to either of the following is "Yes", a more complete depression screening is indicated)   Over the past two weeks, have you felt down, depressed or hopeless? No  Over the past two weeks, have you felt little interest or pleasure in doing things? No Have you lost interest or pleasure in daily life? No Do you often feel hopeless? No Do you cry easily over simple problems? No   Activities of Daily Living  In your present state of health, do you have any difficulty performing the following activities?:   Driving? No  Managing money? No  Feeding yourself? No  Getting from bed to chair? No  Climbing a flight of stairs? No  Preparing food and eating?: No  Bathing or showering? No  Getting dressed: No  Getting to the toilet? No  Using the toilet:No  Moving around from place to place: No  In the past year have you fallen or had a near fall?:No  Are you sexually active? No  Do you have more than one partner? No   Hearing Difficulties: No  Do you often ask people to speak up or repeat themselves? No  Do you experience ringing or noises in your ears? No  Do you have difficulty understanding soft or whispered voices? No  Do you feel that you have a problem with memory? No Do you often misplace items? No    Home Safety:  Do you have a smoke alarm at your residence? Yes Do you have grab bars in the bathroom? Do you have throw rugs in your house?   Cognitive Testing  Alert? Yes Normal Appearance?Yes  Oriented to person?  Yes Place? Yes  Time? Yes  Recall of three objects? Yes  Can perform simple calculations? Yes  Displays appropriate judgment?Yes  Can read the correct time from a watch face?Yes   List the Names of Other Physician/Practitioners you currently use:  See referral list for the physicians patient is currently seeing.     Review of Systems:   Objective:     General appearance: Appears stated age and mildly obese  Head: Normocephalic, without obvious abnormality, atraumatic  Eyes: conj clear, EOMi PEERLA  Ears: normal TM's and external ear canals both ears  Nose: Nares normal. Septum midline. Mucosa normal. No drainage or sinus tenderness.  Throat: lips, mucosa, and tongue normal; teeth and gums normal  Neck: no adenopathy, no carotid bruit, no JVD, supple, symmetrical, trachea midline and thyroid not enlarged, symmetric, no tenderness/mass/nodules  No CVA tenderness.  Lungs: clear to auscultation bilaterally  Breasts: normal appearance Heart: regular rate and  rhythm, S1, S2 normal, no murmur, click, rub or gallop  Abdomen: soft, non-tender; bowel sounds normal; no masses, no organomegaly  Musculoskeletal: Significant spasticity Skin: Skin color, texture, turgor normal.  seborrheic dermatitis of face Lymph nodes: Cervical, supraclavicular, and axillary nodes normal.  Neurologic: speech impediment spastic quadriplegia Psych: Alert, Mood appears stable.    Assessment:    Annual wellness medicare exam   Plan:    During the course of the visit the patient was educated and counseled about appropriate screening and preventive services including:   Annual flu vaccine     Patient Instructions (the written plan) was given to the patient.  Medicare Attestation  I have personally reviewed:  The patient's medical and social history  Their use of alcohol, tobacco or illicit drugs  Their current medications and supplements  The patient's functional ability including ADLs,fall  risks, home safety risks, cognitive, and hearing and visual impairment  Diet and physical activities  Evidence for depression or mood disorders  The patient's weight, height, BMI, and visual acuity have been recorded in the chart. I have made referrals, counseling, and provided education to the patient based on review of the above and I have provided the patient with a written personalized care plan for preventive services.

## 2018-03-03 NOTE — Patient Instructions (Addendum)
Continue same treatment and return in 1 year.  Order signed on March 19 for four-point lap belt and repair to a wheelchair.  2000 units vitamin D3 daily

## 2018-03-09 ENCOUNTER — Other Ambulatory Visit: Payer: Self-pay

## 2018-03-09 MED ORDER — VITAMIN D 50 MCG (2000 UT) PO TABS: 2000.0000 [IU] | ORAL_TABLET | Freq: Every day | ORAL | 0 refills | Status: AC

## 2018-06-24 ENCOUNTER — Telehealth: Payer: Self-pay

## 2018-06-24 NOTE — Telephone Encounter (Signed)
Patient's father called to let us know that some parts of patient's power wheel chair broke and need to be replaced, he said we will be receiving a fax to be singed and fax back asap so insurance can pay for new parts. Thanks.

## 2018-12-22 ENCOUNTER — Telehealth: Payer: Self-pay

## 2018-12-22 NOTE — Telephone Encounter (Signed)
She called to schedule patient's CPE in March, she said patient's FL2 form expires on 02/03/2019 and she would like to know if she can fax this form over to be complete it?

## 2018-12-22 NOTE — Telephone Encounter (Signed)
Yes to Michelle's attention

## 2018-12-22 NOTE — Telephone Encounter (Signed)
Notified. 

## 2019-02-23 ENCOUNTER — Other Ambulatory Visit: Payer: Medicare Other | Admitting: Internal Medicine

## 2019-02-23 DIAGNOSIS — E559 Vitamin D deficiency, unspecified: Secondary | ICD-10-CM | POA: Diagnosis not present

## 2019-02-23 DIAGNOSIS — Z125 Encounter for screening for malignant neoplasm of prostate: Secondary | ICD-10-CM

## 2019-02-23 DIAGNOSIS — R319 Hematuria, unspecified: Secondary | ICD-10-CM | POA: Diagnosis not present

## 2019-02-23 DIAGNOSIS — Z0000 Encounter for general adult medical examination without abnormal findings: Secondary | ICD-10-CM | POA: Diagnosis not present

## 2019-02-23 DIAGNOSIS — M7918 Myalgia, other site: Secondary | ICD-10-CM | POA: Diagnosis not present

## 2019-02-23 DIAGNOSIS — R7989 Other specified abnormal findings of blood chemistry: Secondary | ICD-10-CM | POA: Diagnosis not present

## 2019-02-23 DIAGNOSIS — H9193 Unspecified hearing loss, bilateral: Secondary | ICD-10-CM

## 2019-02-23 DIAGNOSIS — L219 Seborrheic dermatitis, unspecified: Secondary | ICD-10-CM | POA: Diagnosis not present

## 2019-02-23 DIAGNOSIS — G808 Other cerebral palsy: Secondary | ICD-10-CM | POA: Diagnosis not present

## 2019-02-23 LAB — LIPID PANEL
CHOL/HDL RATIO: 3.7 (calc) (ref <5.0)
Cholesterol: 161 mg/dL (ref <200)
HDL: 43 mg/dL (ref >=40)
LDL Cholesterol (Calc): 98 mg/dL (calc)
NON-HDL CHOLESTEROL (CALC): 118 mg/dL (ref <130)
TRIGLYCERIDES: 106 mg/dL (ref <150)

## 2019-02-23 LAB — CBC WITH DIFFERENTIAL/PLATELET
ABSOLUTE MONOCYTES: 966 {cells}/uL — AB (ref 200–950)
BASOS ABS: 54 {cells}/uL (ref 0–200)
Basophils Relative: 0.4 %
EOS PCT: 2.6 %
Eosinophils Absolute: 354 cells/uL (ref 15–500)
HEMATOCRIT: 40.6 % (ref 38.5–50.0)
HEMOGLOBIN: 14.3 g/dL (ref 13.2–17.1)
LYMPHS ABS: 1890 {cells}/uL (ref 850–3900)
MCH: 33.4 pg — ABNORMAL HIGH (ref 27.0–33.0)
MCHC: 35.2 g/dL (ref 32.0–36.0)
MCV: 94.9 fL (ref 80.0–100.0)
MPV: 14.3 fL — ABNORMAL HIGH (ref 7.5–12.5)
Monocytes Relative: 7.1 %
NEUTROS ABS: 10336 {cells}/uL — AB (ref 1500–7800)
Neutrophils Relative %: 76 %
Platelets: 163 10*3/uL (ref 140–400)
RBC: 4.28 10*6/uL (ref 4.20–5.80)
RDW: 12.9 % (ref 11.0–15.0)
Total Lymphocyte: 13.9 %
WBC: 13.6 10*3/uL — ABNORMAL HIGH (ref 3.8–10.8)

## 2019-02-23 LAB — COMPLETE METABOLIC PANEL WITH GFR
AG RATIO: 1.6 (calc) (ref 1.0–2.5)
ALBUMIN MSPROF: 4 g/dL (ref 3.6–5.1)
ALT: 11 U/L (ref 9–46)
AST: 14 U/L (ref 10–35)
Alkaline phosphatase (APISO): 69 U/L (ref 35–144)
BUN/Creatinine Ratio: 15 (calc) (ref 6–22)
BUN: 31 mg/dL — ABNORMAL HIGH (ref 7–25)
CALCIUM: 9.4 mg/dL (ref 8.6–10.3)
CO2: 27 mmol/L (ref 20–32)
CREATININE: 2.01 mg/dL — AB (ref 0.70–1.33)
Chloride: 104 mmol/L (ref 98–110)
GFR, EST AFRICAN AMERICAN: 43 mL/min/{1.73_m2} — AB (ref >=60)
GFR, EST NON AFRICAN AMERICAN: 37 mL/min/{1.73_m2} — AB (ref >=60)
GLOBULIN: 2.5 g/dL (ref 1.9–3.7)
Glucose, Bld: 89 mg/dL (ref 65–99)
Potassium: 3.7 mmol/L (ref 3.5–5.3)
SODIUM: 145 mmol/L (ref 135–146)
Total Bilirubin: 0.7 mg/dL (ref 0.2–1.2)
Total Protein: 6.5 g/dL (ref 6.1–8.1)

## 2019-02-23 LAB — PSA: PSA: 0.3 ng/mL (ref <=4.0)

## 2019-02-26 ENCOUNTER — Telehealth: Payer: Self-pay | Admitting: Internal Medicine

## 2019-02-26 ENCOUNTER — Ambulatory Visit (INDEPENDENT_AMBULATORY_CARE_PROVIDER_SITE_OTHER): Payer: Medicare Other | Admitting: Internal Medicine

## 2019-02-26 ENCOUNTER — Other Ambulatory Visit: Payer: Self-pay

## 2019-02-26 ENCOUNTER — Encounter: Payer: Self-pay | Admitting: Internal Medicine

## 2019-02-26 VITALS — BP 130/90 | HR 94

## 2019-02-26 DIAGNOSIS — H9193 Unspecified hearing loss, bilateral: Secondary | ICD-10-CM | POA: Diagnosis not present

## 2019-02-26 DIAGNOSIS — Z0000 Encounter for general adult medical examination without abnormal findings: Secondary | ICD-10-CM | POA: Diagnosis not present

## 2019-02-26 DIAGNOSIS — M7918 Myalgia, other site: Secondary | ICD-10-CM | POA: Diagnosis not present

## 2019-02-26 DIAGNOSIS — G800 Spastic quadriplegic cerebral palsy: Secondary | ICD-10-CM

## 2019-02-26 DIAGNOSIS — Z23 Encounter for immunization: Secondary | ICD-10-CM

## 2019-02-26 DIAGNOSIS — L219 Seborrheic dermatitis, unspecified: Secondary | ICD-10-CM

## 2019-02-26 DIAGNOSIS — D72829 Elevated white blood cell count, unspecified: Secondary | ICD-10-CM

## 2019-02-26 DIAGNOSIS — Z111 Encounter for screening for respiratory tuberculosis: Secondary | ICD-10-CM

## 2019-02-26 DIAGNOSIS — R7989 Other specified abnormal findings of blood chemistry: Secondary | ICD-10-CM | POA: Diagnosis not present

## 2019-02-26 NOTE — Telephone Encounter (Signed)
Faxed completed FL2 to [facility] fax # [phone] phone [phone]

## 2019-02-28 LAB — CBC WITH DIFFERENTIAL/PLATELET
Absolute Monocytes: 571 {cells}/uL (ref 200–950)
Basophils Absolute: 59 {cells}/uL (ref 0–200)
Basophils Relative: 0.7 %
Eosinophils Absolute: 462 {cells}/uL (ref 15–500)
Eosinophils Relative: 5.5 %
HCT: 39.9 % (ref 38.5–50.0)
Hemoglobin: 13.5 g/dL (ref 13.2–17.1)
Lymphs Abs: 2369 {cells}/uL (ref 850–3900)
MCH: 31.8 pg (ref 27.0–33.0)
MCHC: 33.8 g/dL (ref 32.0–36.0)
MCV: 93.9 fL (ref 80.0–100.0)
MPV: 14 fL — ABNORMAL HIGH (ref 7.5–12.5)
Monocytes Relative: 6.8 %
Neutro Abs: 4939 {cells}/uL (ref 1500–7800)
Neutrophils Relative %: 58.8 %
Platelets: 195 Thousand/uL (ref 140–400)
RBC: 4.25 Million/uL (ref 4.20–5.80)
RDW: 12.7 % (ref 11.0–15.0)
Total Lymphocyte: 28.2 %
WBC: 8.4 Thousand/uL (ref 3.8–10.8)

## 2019-02-28 LAB — QUANTIFERON-TB GOLD PLUS
Mitogen-NIL: >10 [IU]/mL
NIL: 0.07 [IU]/mL
QuantiFERON-TB Gold Plus: NEGATIVE
TB1-NIL: 0.03 [IU]/mL
TB2-NIL: <0 [IU]/mL

## 2019-02-28 LAB — BASIC METABOLIC PANEL
BUN: 22 mg/dL (ref 7–25)
CO2: 26 mmol/L (ref 20–32)
Calcium: 9.2 mg/dL (ref 8.6–10.3)
Chloride: 108 mmol/L (ref 98–110)
Creat: 1.24 mg/dL (ref 0.70–1.33)
Glucose, Bld: 93 mg/dL (ref 65–99)
Potassium: 4 mmol/L (ref 3.5–5.3)
Sodium: 146 mmol/L (ref 135–146)

## 2019-03-02 ENCOUNTER — Telehealth: Payer: Self-pay

## 2019-03-02 NOTE — Telephone Encounter (Signed)
Scheduled

## 2019-03-02 NOTE — Telephone Encounter (Signed)
See in am- need to bring fresh specimen with them

## 2019-03-02 NOTE — Telephone Encounter (Signed)
Patient has been urinating dark brown/blood since the weekend no fever. Dad wants him to be seen ASAP. I asked them to bring [place] a clean catch urine but would ask you what you suggest?

## 2019-03-03 ENCOUNTER — Ambulatory Visit (INDEPENDENT_AMBULATORY_CARE_PROVIDER_SITE_OTHER): Payer: Medicare Other | Admitting: Internal Medicine

## 2019-03-03 ENCOUNTER — Other Ambulatory Visit: Payer: Self-pay

## 2019-03-03 ENCOUNTER — Other Ambulatory Visit: Payer: Self-pay | Admitting: Internal Medicine

## 2019-03-03 ENCOUNTER — Ambulatory Visit
Admission: RE | Admit: 2019-03-03 | Discharge: 2019-03-03 | Disposition: A | Discharge status: Home / Self Care | Payer: Medicare Other | Source: Ambulatory Visit | Attending: Internal Medicine | Admitting: Internal Medicine

## 2019-03-03 ENCOUNTER — Other Ambulatory Visit: Payer: Medicare Other

## 2019-03-03 ENCOUNTER — Encounter: Payer: Self-pay | Admitting: Internal Medicine

## 2019-03-03 VITALS — BP 140/100 | HR 80 | Temp 98.5°F

## 2019-03-03 DIAGNOSIS — R310 Gross hematuria: Secondary | ICD-10-CM

## 2019-03-03 DIAGNOSIS — R8290 Unspecified abnormal findings in urine: Secondary | ICD-10-CM | POA: Diagnosis not present

## 2019-03-03 DIAGNOSIS — R319 Hematuria, unspecified: Secondary | ICD-10-CM | POA: Diagnosis not present

## 2019-03-03 DIAGNOSIS — N132 Hydronephrosis with renal and ureteral calculous obstruction: Secondary | ICD-10-CM | POA: Diagnosis not present

## 2019-03-03 LAB — POCT URINALYSIS DIPSTICK
Bilirubin, UA: NEGATIVE
Glucose, UA: NEGATIVE
Ketones, UA: NEGATIVE
Nitrite, UA: NEGATIVE
PH UA: 6.5 (ref 5.0–8.0)
Protein, UA: POSITIVE — AB
SPEC GRAV UA: 1.015 (ref 1.010–1.025)
Urobilinogen, UA: 0.2 E.U./dL

## 2019-03-03 NOTE — Progress Notes (Signed)
Subjective:    Patient ID: Dennis Walsh, male    DOB: 03/05/1966, 53 y.o.   MRN: [id number]  HPI 53 year old White Male with cerebral palsy and spastic quadriplegia in today accompanied by his parents for health maintenance exam and evaluation of medical issues.  He resides in a group home on [address].  He has a motorized wheelchair.  Recently it has not been functioning properly.  He is complaining of some back pain which we think may be musculoskeletal pain.  History of seborrhea of the face treated with Nizoral cream.  He has issues with constipation.  He takes daily fiber and MiraLAX.  He tends to have a large bowel movement every 3 days or so.  He is on Paxil for agitation.  We have kept him on this for a number of years and seems to help his affect.  He has a history of hearing loss and speech impediment.  Sometimes he is difficult to understand.  No significant change in his health.  His appetite is good.  No known drug allergies.  Adhesive capsulitis right shoulder 2009.  He has never lived independently.  Cerebral palsy was thought to be secondary to an Rh incompatibility and complications of birth.  He is partially independent.  He has to be fed because of significant spasticity and decreased range of motion of the right upper extremity.  He is right-handed.  Mother says this became more of an issue after his cervical neck surgery in 2008.  His father is his guardian and his mother has power of attorney as I understand it.  Mother indicates that he has been declared incompetent cerebral years ago and cannot decide matters for himself.  Parents are divorced.  He has no siblings.  He does not smoke, use alcohol or use illicit drugs.  He enjoys watching TV.  He enjoys basketball.  His affect is generally pleasant.  In September 2010, he had lengthening of biceps tendon with lengthening of brachioradialis is on the right.  He had excision of his distal clavicle on the right and  denervation of the right upper extremity performed by orthopedist at [facility].  He subsequently had tachycardia and hypoxia in the postop.  And was thought to have aspirated.  Chest x-ray showed bibasilar opacities.  CT was negative for PE.  He was treated with antibiotics and showed improvement.    History of spinal stenosis of the C-spine with decompression and fusion from C2-T1 for cervical spondylosis with myelopathy April 2008.    ROS: Spastic quadriplegia, speech impediment, hearing impairment     Objective:   Physical Exam Vitals signs reviewed.  Constitutional:      General: He is not in acute distress.    Appearance: He is not diaphoretic.  HENT:     Head: Normocephalic and atraumatic.     Right Ear: Tympanic membrane and external ear normal.     Left Ear: Tympanic membrane and external ear normal.     Nose: Nose normal.     Mouth/Throat:     Mouth: Mucous membranes are moist.     Pharynx: Oropharynx is clear.  Eyes:     General: No scleral icterus.    Extraocular Movements: Extraocular movements intact.     Conjunctiva/sclera: Conjunctivae normal.     Pupils: Pupils are equal, round, and reactive to light.  Neck:     Musculoskeletal: Neck supple.  Cardiovascular:     Rate and Rhythm: Normal rate  and regular rhythm.     Heart sounds: Normal heart sounds. No murmur.  Pulmonary:     Effort: No respiratory distress.     Breath sounds: Normal breath sounds. No wheezing or rales.  Abdominal:     General: Bowel sounds are normal.     Palpations: Abdomen is soft. There is no mass.     Tenderness: There is no abdominal tenderness.  Genitourinary:    Comments: Prostate not examined.  PSA is normal.  Unable to get urine specimen. Musculoskeletal:     Right lower leg: No edema.     Left lower leg: No edema.  Skin:    General: Skin is warm and dry.     Comments: Facial seborrhea  Neurological:     Mental Status: He is alert.     Comments:  Spastic quadriplegia, speech impediment, hearing impediment.  Cooperative.  Smiles at times.    He cannot be weight due to his disability.  He is wheelchair-bound.  Cannot stand on his own 2 feet.  Vital signs reviewed.       Assessment & Plan:  Cerebral palsy with spastic quadriplegia  Seborrhea of face  Complaint of back pain-?  Musculoskeletal secondary to motorized wheelchair that needs repair.  He has ibuprofen for musculoskeletal pain.  His his white blood cell count is elevated on recent lab work and will be repeated today-Addendum: Repeat white blood cell count on March 13 is normal at 8400 .  His creatinine from March 10 is elevated at 2.01.  He has never had a creatinine this high.  It will be repeated.  Addendum: Repeat creatinine on March 13 is normal at 1.24  QuantiFERON test is negative-required by group home  Forms for group home completed.  Subjective:   Patient presents for Medicare Annual/Subsequent preventive examination.  Review Past Medical/Family/Social: See above   Risk Factors  Current exercise habits: Sedentary Dietary issues discussed: Low-fat low carbohydrate through group home  Cardiac risk factors:-Does not have hyperlipidemia or diabetes  Depression Screen  (Note: if answer to either of the following is "Yes", a more complete depression screening is indicated)   Over the past two weeks, have you felt down, depressed or hopeless?  Unable to assess Over the past two weeks, have you felt little interest or pleasure in doing things?  Family and group home worker thinks he is stable.  Likes to watch basketball Have you lost interest or pleasure in daily life?  Only since basketball has been discontinued Do you often feel hopeless?  Seems relatively happy Do you cry easily over simple problems?  Not usually  Activities of Daily Living  In your present state of health, do you have any difficulty performing the following activities?:   Driving?   Does not drive Managing money?  Does not manage money Feeding yourself?  Needs assistance Getting from bed to chair?  Needs assistance Climbing a Walsh of stairs? No  Preparing food and eating?:  Lives in group home for meals are prepared Bathing or showering?  Yes needs assistance Getting dressed: Yes needs assistance Getting to the toilet?  Yes needs assistance Using the toilet: Yes needs assistance Moving around from place to place: Needs assistance In the past year have you fallen or had a near fall?:  Not ask Are you sexually active? No  Do you have more than one partner? No   Hearing Difficulties: No  Do you often ask people to speak up or repeat themselves? No  Do you experience ringing or noises in your ears? No  Do you have difficulty understanding soft or whispered voices? No  Do you feel that you have a problem with memory? No Do you often misplace items? No    Home Safety:  Do you have a smoke alarm at your residence? Yes Do you have grab bars in the bathroom? Do you have throw rugs in your house?   Cognitive Testing  Alert? Yes Normal Appearance?Yes  Oriented to person? Yes Place? Yes  Time?  Not tested Recall of three objects?  Not tested Can perform simple calculations?  Not tested Displays appropriate judgment?  Not tested can read the correct time from a watch face?  Not tested  List the Names of Other Physician/Practitioners you currently use:  See referral list for the physicians patient is currently seeing.  Currently does not see any other physicians   Review of Systems: See above history of constipation  Objective:     General appearance: Appears stated age  Head: Normocephalic, without obvious abnormality, atraumatic  Eyes: conj clear, EOMi PEERLA  Ears: normal TM's and external ear canals both ears  Nose: Nares normal. Septum midline. Mucosa normal. Throat: lips, mucosa, and tongue normal; teeth in fairly good repair and gums normal  Neck:  no adenopathy, no carotid bruit, no JVD, supple, symmetrical, trachea midline and thyroid not enlarged, symmetric, no tenderness/mass/nodules  No CVA tenderness.  Lungs: clear to auscultation bilaterally  Breasts: normal appearance, no masses or tenderness Heart: regular rate and rhythm, S1, S2 normal, no murmur, click, rub or gallop  Abdomen: soft, non-tender; bowel sounds normal; no masses, no organomegaly  Musculoskeletal: Contracture right upper extremity and does not move right arm very much.  No lower extremity edema. Skin: Skin color, texture, turgor normal.  Facial and ear scaling consistent with seborrhea Lymph nodes: Cervical, supraclavicular, and axillary nodes normal.  Neurologic: Difficult to assess due to spastic quadriplegia.  He is alert.  PERRLA.  Neurological status seems baseline for the past several years without much change. Psych: Alert  Mood appears stable.    Assessment:    Annual wellness medicare exam   Plan:    During the course of the visit the patient was educated and counseled about appropriate screening and preventive services including:        Patient Instructions (the written plan) was given to the patient.  Medicare Attestation  I have personally reviewed:  The patient's medical and social history  Their use of alcohol, tobacco or illicit drugs  Their current medications and supplements  The patient's functional ability including ADLs,fall risks, home safety risks, cognitive, and hearing and visual impairment  Diet and physical activities  Evidence for depression or mood disorders  The patient's weight, height, BMI, and visual acuity have been recorded in the chart. I have made referrals, counseling, and provided education to the patient based on review of the above and I have provided the patient with a written personalized care plan for preventive services.

## 2019-03-03 NOTE — Telephone Encounter (Signed)
CT shows bilateral hydronephrosis due to kidney stones. Has appt with Dr. Ronne Binning @ [facility] tomorrow at 9:45am. Results given to father via phone along with appt time. He will contact group home.

## 2019-03-03 NOTE — Progress Notes (Signed)
   Subjective:    Patient ID: Dennis Walsh, male    DOB: 1966/11/27, 53 y.o.   MRN: [id number]  HPI Patient was just seen here for complete physical examination and fasting lab work on March 13.  Over the weekend he developed gross hematuria according to personnel at his group home.  He has been complaining of back pain but everyone thought it was related to his chair that needs to be repaired.  He has musculoskeletal pain from time to time and takes ibuprofen.  He was given fluids here in the office and subsequent urine specimen appeared to be clear but initial specimen brought from his first void this morning was dark reddish brown and was positive for occult blood.  It was not in a sterile container.  He denies dysuria.  He vomited once over the past few days.  No documented fever or chills.  He does not urinate many times daily.  If he urinates in the morning he does not urinate again until around 2 PM.  He has cerebral palsy with spastic quadriplegia.  He lives in a group home here in town.  His parents are supportive.  In his old paper chart, I found he was seen by Dr. Teddy Spike twice once in July 2003 and once in August 2003 with neurogenic bladder with dysuria.  He had a renal ultrasound.  Right kidney was 10 cm and left kidney 10.3 cm.  Left kidney had a rounded parenchymal outline but no obvious stone.  No obvious mass.  Right kidney was more elongated.  Apparently had had an episode of acute cystitis.  Was tried on Ditropan and Flomax.  He had fiberoptic cystoscopy showing normal urethra and sphincter.  Had short nonobstructive prostatic urethra.  Post void residual was about 35 cc.  Bladder showed trabeculation but no evidence of tumor stone bleeding or other abnormality.  Although culture was negative was felt to have probably had episode of cystitis.    Although the second urine specimen today appeared to be clear, dipstick did show red cells and white cells and was sent for  culture.  He is scheduled for CT with  contrast later today.  Review of Systems see above-complaining of some low back pain bilaterally.  Given Tylenol in the office.     Objective:   Physical Exam He is alert and cooperative.  Able to void without difficulty.       Assessment & Plan:  Gross hematuria with dark reddish-brown urine on initial specimen this morning but after drinking fluids  Specimen appeared to be fairly clear and light yellow  ?  Abnormality of renal parenchyma versus bladder tumor  Plan: CT with contrast planned for later today.  Culture of urine sent.  Likely will need urology consultation.  35 minutes spent with patient and parents today including consultation, evaluation and management and arrangements for CT

## 2019-03-03 NOTE — Patient Instructions (Signed)
CBC and creatinine repeated and are normal.  QuantiFERON test is negative.  Tdap vaccine given.  Return in 1 year or as needed.  Forms completed for group home.  May take ibuprofen for musculoskeletal pain.

## 2019-03-03 NOTE — Patient Instructions (Signed)
To have CT of abdomen and pelvis with contrast later today.  Further instructions to follow.  Urine culture pending.

## 2019-03-04 ENCOUNTER — Other Ambulatory Visit: Payer: Self-pay

## 2019-03-04 ENCOUNTER — Encounter (INDEPENDENT_AMBULATORY_CARE_PROVIDER_SITE_OTHER): Payer: Self-pay

## 2019-03-04 ENCOUNTER — Encounter (HOSPITAL_COMMUNITY): Payer: Self-pay | Admitting: Certified Registered Nurse Anesthetist

## 2019-03-04 ENCOUNTER — Ambulatory Visit (HOSPITAL_COMMUNITY): Payer: Medicare Other

## 2019-03-04 ENCOUNTER — Ambulatory Visit (HOSPITAL_COMMUNITY): Payer: Medicare Other | Admitting: Certified Registered Nurse Anesthetist

## 2019-03-04 ENCOUNTER — Other Ambulatory Visit: Payer: Self-pay | Admitting: Urology

## 2019-03-04 ENCOUNTER — Ambulatory Visit (HOSPITAL_COMMUNITY)
Admission: AD | Admit: 2019-03-04 | Discharge: 2019-03-04 | Disposition: A | Discharge status: Home / Self Care | Payer: Medicare Other | Attending: Urology | Admitting: Urology

## 2019-03-04 ENCOUNTER — Encounter (HOSPITAL_COMMUNITY)
Admission: AD | Disposition: A | Discharge status: Home / Self Care | Payer: Self-pay | Source: Home / Self Care | Attending: Urology

## 2019-03-04 DIAGNOSIS — K219 Gastro-esophageal reflux disease without esophagitis: Secondary | ICD-10-CM | POA: Diagnosis not present

## 2019-03-04 DIAGNOSIS — F70 Mild intellectual disabilities: Secondary | ICD-10-CM | POA: Diagnosis not present

## 2019-03-04 DIAGNOSIS — Z419 Encounter for procedure for purposes other than remedying health state, unspecified: Secondary | ICD-10-CM

## 2019-03-04 DIAGNOSIS — G808 Other cerebral palsy: Secondary | ICD-10-CM | POA: Insufficient documentation

## 2019-03-04 DIAGNOSIS — N132 Hydronephrosis with renal and ureteral calculous obstruction: Secondary | ICD-10-CM | POA: Insufficient documentation

## 2019-03-04 DIAGNOSIS — M170 Bilateral primary osteoarthritis of knee: Secondary | ICD-10-CM | POA: Diagnosis not present

## 2019-03-04 DIAGNOSIS — N201 Calculus of ureter: Secondary | ICD-10-CM

## 2019-03-04 DIAGNOSIS — H9190 Unspecified hearing loss, unspecified ear: Secondary | ICD-10-CM | POA: Insufficient documentation

## 2019-03-04 HISTORY — PX: CYSTOSCOPY/URETEROSCOPY/HOLMIUM LASER/STENT PLACEMENT: SHX6546

## 2019-03-04 LAB — URINE CULTURE
MICRO NUMBER:: 332658
SPECIMEN QUALITY:: ADEQUATE

## 2019-03-04 SURGERY — CYSTOSCOPY/URETEROSCOPY/HOLMIUM LASER/STENT PLACEMENT
Anesthesia: General | Laterality: Bilateral

## 2019-03-04 MED ORDER — FENTANYL CITRATE (PF) 100 MCG/2ML IJ SOLN
25.0000 ug | INTRAMUSCULAR | Status: DC | PRN
  Administered 2019-03-04 (×2): 25 ug via INTRAVENOUS

## 2019-03-04 MED ORDER — PROPOFOL 10 MG/ML IV BOLUS
INTRAVENOUS | Status: DC | PRN
  Administered 2019-03-04: 130 mg via INTRAVENOUS

## 2019-03-04 MED ORDER — LACTATED RINGERS IV SOLN
INTRAVENOUS | Status: DC
  Administered 2019-03-04: 14:00:00 via INTRAVENOUS

## 2019-03-04 MED ORDER — SUGAMMADEX SODIUM 200 MG/2ML IV SOLN
INTRAVENOUS | Status: DC | PRN
  Administered 2019-03-04: 200 mg via INTRAVENOUS

## 2019-03-04 MED ORDER — ROCURONIUM BROMIDE 10 MG/ML (PF) SYRINGE
PREFILLED_SYRINGE | INTRAVENOUS | Status: AC
  Filled 2019-03-04: qty 10

## 2019-03-04 MED ORDER — CEFAZOLIN SODIUM-DEXTROSE 2-4 GM/100ML-% IV SOLN
INTRAVENOUS | Status: AC
  Filled 2019-03-04: qty 100

## 2019-03-04 MED ORDER — LIDOCAINE 2% (20 MG/ML) 5 ML SYRINGE
INTRAMUSCULAR | Status: DC | PRN
  Administered 2019-03-04: 60 mg via INTRAVENOUS

## 2019-03-04 MED ORDER — FENTANYL CITRATE (PF) 250 MCG/5ML IJ SOLN: INTRAMUSCULAR | Status: DC | PRN

## 2019-03-04 MED ORDER — PROPOFOL 10 MG/ML IV BOLUS
INTRAVENOUS | Status: AC
  Filled 2019-03-04: qty 20

## 2019-03-04 MED ORDER — HYDROCODONE-ACETAMINOPHEN 5-325 MG PO TABS: 1.0000 | ORAL_TABLET | ORAL | 0 refills | Status: DC | PRN

## 2019-03-04 MED ORDER — FENTANYL CITRATE (PF) 100 MCG/2ML IJ SOLN
INTRAMUSCULAR | Status: DC | PRN
  Administered 2019-03-04: 50 ug via INTRAVENOUS

## 2019-03-04 MED ORDER — FENTANYL CITRATE (PF) 250 MCG/5ML IJ SOLN
INTRAMUSCULAR | Status: DC | PRN
  Administered 2019-03-04: 50 ug via INTRAVENOUS
  Administered 2019-03-04 (×2): 25 ug via INTRAVENOUS

## 2019-03-04 MED ORDER — DEXAMETHASONE SODIUM PHOSPHATE 10 MG/ML IJ SOLN
INTRAMUSCULAR | Status: DC | PRN
  Administered 2019-03-04: 5 mg via INTRAVENOUS

## 2019-03-04 MED ORDER — FENTANYL CITRATE (PF) 100 MCG/2ML IJ SOLN
INTRAMUSCULAR | Status: AC
  Filled 2019-03-04: qty 2

## 2019-03-04 MED ORDER — PHENYLEPHRINE 40 MCG/ML (10ML) SYRINGE FOR IV PUSH (FOR BLOOD PRESSURE SUPPORT)
PREFILLED_SYRINGE | INTRAVENOUS | Status: AC
  Filled 2019-03-04: qty 10

## 2019-03-04 MED ORDER — ARTIFICIAL TEARS OPHTHALMIC OINT
TOPICAL_OINTMENT | OPHTHALMIC | Status: DC | PRN
  Administered 2019-03-04: 1 via OPHTHALMIC

## 2019-03-04 MED ORDER — LIDOCAINE 2% (20 MG/ML) 5 ML SYRINGE
INTRAMUSCULAR | Status: AC
  Filled 2019-03-04: qty 5

## 2019-03-04 MED ORDER — ONDANSETRON HCL 4 MG/2ML IJ SOLN: 4.0000 mg | Freq: Once | INTRAMUSCULAR | Status: DC | PRN

## 2019-03-04 MED ORDER — FENTANYL CITRATE (PF) 100 MCG/2ML IJ SOLN
INTRAMUSCULAR | Status: AC
  Administered 2019-03-04: 25 ug via INTRAVENOUS
  Filled 2019-03-04: qty 2

## 2019-03-04 MED ORDER — ROCURONIUM BROMIDE 10 MG/ML (PF) SYRINGE
PREFILLED_SYRINGE | INTRAVENOUS | Status: DC | PRN
  Administered 2019-03-04: 60 mg via INTRAVENOUS

## 2019-03-04 MED ORDER — EPHEDRINE 5 MG/ML INJ
INTRAVENOUS | Status: AC
  Filled 2019-03-04: qty 10

## 2019-03-04 MED ORDER — SODIUM CHLORIDE 0.9 % IR SOLN
Status: DC | PRN
  Administered 2019-03-04: 3000 mL via INTRAVESICAL

## 2019-03-04 MED ORDER — CEFAZOLIN SODIUM-DEXTROSE 2-4 GM/100ML-% IV SOLN
2.0000 g | INTRAVENOUS | Status: AC
  Administered 2019-03-04: 2 g via INTRAVENOUS

## 2019-03-04 MED ORDER — ACETAMINOPHEN 500 MG PO TABS
1000.0000 mg | ORAL_TABLET | Freq: Once | ORAL | Status: AC
  Administered 2019-03-04: 1000 mg via ORAL
  Filled 2019-03-04: qty 2

## 2019-03-04 MED ORDER — EPHEDRINE SULFATE-NACL 50-0.9 MG/10ML-% IV SOSY
PREFILLED_SYRINGE | INTRAVENOUS | Status: DC | PRN
  Administered 2019-03-04 (×2): 10 mg via INTRAVENOUS

## 2019-03-04 MED ORDER — MIDAZOLAM HCL 2 MG/2ML IJ SOLN
INTRAMUSCULAR | Status: AC
  Filled 2019-03-04: qty 2

## 2019-03-04 MED ORDER — SUCCINYLCHOLINE CHLORIDE 200 MG/10ML IV SOSY
PREFILLED_SYRINGE | INTRAVENOUS | Status: AC
  Filled 2019-03-04: qty 10

## 2019-03-04 MED ORDER — SUGAMMADEX SODIUM 200 MG/2ML IV SOLN
INTRAVENOUS | Status: AC
  Filled 2019-03-04: qty 2

## 2019-03-04 MED ORDER — IOHEXOL 300 MG/ML  SOLN
INTRAMUSCULAR | Status: DC | PRN
  Administered 2019-03-04: 10 mL via INTRAVENOUS

## 2019-03-04 MED ORDER — TAMSULOSIN HCL 0.4 MG PO CAPS: 0.4000 mg | ORAL_CAPSULE | Freq: Every day | ORAL | 11 refills | Status: DC

## 2019-03-04 MED ORDER — ONDANSETRON HCL 4 MG/2ML IJ SOLN
INTRAMUSCULAR | Status: DC | PRN
  Administered 2019-03-04: 4 mg via INTRAVENOUS

## 2019-03-04 SURGICAL SUPPLY — 22 items
BAG URO CATCHER STRL LF (MISCELLANEOUS) ×3 IMPLANT
CATH INTERMIT  6FR 70CM (CATHETERS) ×3 IMPLANT
CLOTH BEACON ORANGE TIMEOUT ST (SAFETY) ×3 IMPLANT
COVER WAND RF STERILE (DRAPES) IMPLANT
EXTRACTOR STONE 1.7FRX115CM (UROLOGICAL SUPPLIES) ×2 IMPLANT
EXTRACTOR STONE NITINOL NGAGE (UROLOGICAL SUPPLIES) IMPLANT
FIBER LASER TRAC TIP (UROLOGICAL SUPPLIES) ×2 IMPLANT
GLOVE BIO SURGEON STRL SZ8 (GLOVE) ×3 IMPLANT
GOWN STRL REUS W/TWL XL LVL3 (GOWN DISPOSABLE) ×3 IMPLANT
GUIDEWIRE ANG ZIPWIRE 038X150 (WIRE) ×5 IMPLANT
GUIDEWIRE STR DUAL SENSOR (WIRE) ×2 IMPLANT
IV NS 1000ML (IV SOLUTION) ×3
IV NS 1000ML BAXH (IV SOLUTION) ×1 IMPLANT
KIT TURNOVER KIT A (KITS) IMPLANT
MANIFOLD NEPTUNE II (INSTRUMENTS) ×3 IMPLANT
PACK CYSTO (CUSTOM PROCEDURE TRAY) ×3 IMPLANT
SHEATH URETERAL 12FRX35CM (MISCELLANEOUS) IMPLANT
STENT CONTOUR 6FRX26X.038 (STENTS) ×2 IMPLANT
STENT URET 6FRX26 CONTOUR (STENTS) IMPLANT
TUBE FEEDING 8FR 16IN STR KANG (MISCELLANEOUS) IMPLANT
TUBING CONNECTING 10 (TUBING) ×2 IMPLANT
TUBING CONNECTING 10' (TUBING) ×1

## 2019-03-04 NOTE — Transfer of Care (Signed)
Immediate Anesthesia Transfer of Care Note  Patient: KARTIKEYA EBERLE  Procedure(s) Performed: CYSTOSCOPY BILATERAL RETROGRADE PYELOGRAMS; LEFT LASER LITHORTRIPSY; LEFT URETERAL STENT PLACEMENT (Bilateral )  Patient Location: PACU  Anesthesia Type:General  Level of Consciousness: awake, alert  and patient cooperative  Airway & Oxygen Therapy: Patient Spontanous Breathing and Patient connected to face mask oxygen  Post-op Assessment: Report given to RN and Post -op Vital signs reviewed and stable  Post vital signs: Reviewed and stable  Last Vitals:  Vitals Value Taken Time  BP 138/102 03/04/2019  5:24 PM  Temp    Pulse 108 03/04/2019  5:25 PM  Resp 23 03/04/2019  5:25 PM  SpO2 99 % 03/04/2019  5:25 PM  Vitals shown include unvalidated device data.  Last Pain:  Vitals:   03/04/19 1337  TempSrc:   PainSc: 0-No pain         Complications: No apparent anesthesia complications

## 2019-03-04 NOTE — Op Note (Signed)
.  Preoperative diagnosis: bilateral ureteral stone  Postoperative diagnosis: Same  Procedure: 1 cystoscopy 2. bilateralretrograde pyelography 3.  Intraoperative fluoroscopy, under one hour, with interpretation 4.  Bilateral ureteroscopic stone manipulation with laser lithotripsy 5.  Left 6 x 26 JJ stent placement  Attending: Cleda Mccreedy  Anesthesia: General  Estimated blood loss: None  Drains: left 6 x 26 JJ ureteral stent without tether  Specimens: none  Antibiotics: ancef  Findings: bilateral distal ureteral stones. Moderate bilateral hydronephrosis. No masses/lesions in the bladder. Ureteral orifices in normal anatomic location.  Indications: Patient is a 53 year old malee with a history of bilateral distal ureteral stone and elevated creatinine. After discussing treatment options, they decided proceed with bilateral ureteroscopic stone manipulation.  Procedure her in detail: The patient was brought to the operating room and a brief timeout was done to ensure correct patient, correct procedure, correct site.  General anesthesia was administered patient was placed in dorsal lithotomy position.  His genitalia was then prepped and draped in usual sterile fashion.  A rigid 22 French cystoscope was passed in the urethra and the bladder.  Bladder was inspected free masses or lesions.  the ureteral orifices were in the normal orthotopic locations. a 6 french ureteral catheter was then instilled into the left ureteral orifice.  a gentle retrograde was obtained and findings noted above.  Through the catheter a zipwire was advanced up to the renal pelvis. we then removed the cystoscope and cannulated the left ureteral orifice with a semirigid ureteroscope.  We located the stone in the distal ureter and using a 200nm laser fiber the stone was dusted. The larger fragments were evacuated from the with an Ngage basket. We then placed a 6 x 26 double-j ureteral stent over the original zip wire. We  then removed the wire and good coil was noted in the the renal pelvis under fluoroscopy and the bladder under direct vision.  We then turned out attention to the right side. a 6 french ureteral catheter was then instilled into the right ureteral orifice.  a gentle retrograde was obtained and findings noted above.  Through the catheter a zipwire was advanced up to the renal pelvis. we then removed the cystoscope and cannulated the right ureteral orifice with a semirigid ureteroscope.   We located the stone in the distal ureter.  the stone was then removed with a Ngage basket.   Since this was an uncomplicated ureteroscopy we elected not to leave a stent  the bladder was then drained and this concluded the procedure which was well tolerated by patient.  Complications: None  Condition: Stable, extubated, transferred to PACU  Plan: Patient is to be discharged home as to follow-up in 2 weeks for stent removal.

## 2019-03-04 NOTE — Anesthesia Preprocedure Evaluation (Addendum)
Anesthesia Evaluation  Patient identified by MRN, date of birth, ID band Patient awake    Reviewed: Allergy & Precautions, NPO status , Patient's Chart, lab work & pertinent test results  Airway Mallampati: II  TM Distance: >3 FB Neck ROM: Full    Dental  (+) Teeth Intact, Dental Advisory Given   Pulmonary neg pulmonary ROS,    Pulmonary exam normal breath sounds clear to auscultation       Cardiovascular negative cardio ROS Normal cardiovascular exam Rhythm:Regular Rate:Normal     Neuro/Psych PSYCHIATRIC DISORDERS Cerebral palsy, quadriplegic  S/p multilevel ACDF BUE contractures    GI/Hepatic Neg liver ROS, GERD  Medicated,  Endo/Other  negative endocrine ROS  Renal/GU bilateral ueteral calculi     Musculoskeletal  (+) Arthritis , Osteoarthritis,    Abdominal   Peds  (+) mental retardation Hematology negative hematology ROS (+)   Anesthesia Other Findings Day of surgery medications reviewed with the patient.  Reproductive/Obstetrics                            Anesthesia Physical Anesthesia Plan  ASA: III  Anesthesia Plan: General   Post-op Pain Management:    Induction: Intravenous  PONV Risk Score and Plan: 3 and Dexamethasone, Ondansetron and Treatment may vary due to age or medical condition  Airway Management Planned: Oral ETT and Video Laryngoscope Planned  Additional Equipment:   Intra-op Plan:   Post-operative Plan: Extubation in OR  Informed Consent: I have reviewed the patients History and Physical, chart, labs and discussed the procedure including the risks, benefits and alternatives for the proposed anesthesia with the patient or authorized representative who has indicated his/her understanding and acceptance.     Dental advisory given  Plan Discussed with: CRNA  Anesthesia Plan Comments: (NPO since 0715. May proceed at 1515, 8 hrs.)      Anesthesia  Quick Evaluation

## 2019-03-04 NOTE — Discharge Instructions (Addendum)
Ureteral Stent Implantation, Care After °Refer to this sheet in the next few weeks. These instructions provide you with information about caring for yourself after your procedure. Your health care provider may also give you more specific instructions. Your treatment has been planned according to current medical practices, but problems sometimes occur. Call your health care provider if you have any problems or questions after your procedure. °What can I expect after the procedure? °After the procedure, it is common to have: °· Nausea. °· Mild pain when you urinate. You may feel this pain in your lower back or lower abdomen. Pain should stop within a few minutes after you urinate. This may last for up to 1 week. °· A small amount of blood in your urine for several days. °Follow these instructions at home: ° °Medicines °· Take over-the-counter and prescription medicines only as told by your health care provider. °· If you were prescribed an antibiotic medicine, take it as told by your health care provider. Do not stop taking the antibiotic even if you start to feel better. °· Do not drive for 24 hours if you received a sedative. °· Do not drive or operate heavy machinery while taking prescription pain medicines. °Activity °· Return to your normal activities as told by your health care provider. Ask your health care provider what activities are safe for you. °· Do not lift anything that is heavier than 10 lb (4.5 kg). Follow this limit for 1 week after your procedure, or for as long as told by your health care provider. °General instructions °· Watch for any blood in your urine. Call your health care provider if the amount of blood in your urine increases. °· If you have a catheter: °? Follow instructions from your health care provider about taking care of your catheter and collection bag. °? Do not take baths, swim, or use a hot tub until your health care provider approves. °· Drink enough fluid to keep your urine  clear or pale yellow. °· Keep all follow-up visits as told by your health care provider. This is important. °Contact a health care provider if: °· You have pain that gets worse or does not get better with medicine, especially pain when you urinate. °· You have difficulty urinating. °· You feel nauseous or you vomit repeatedly during a period of more than 2 days after the procedure. °Get help right away if: °· Your urine is dark red or has blood clots in it. °· You are leaking urine (have incontinence). °· The end of the stent comes out of your urethra. °· You cannot urinate. °· You have sudden, sharp, or severe pain in your abdomen or lower back. °· You have a fever. °This information is not intended to replace advice given to you by your health care provider. Make sure you discuss any questions you have with your health care provider. °Document Released: 08/04/2013 Document Revised: 05/09/2016 Document Reviewed: 06/16/2015 °Elsevier Interactive Patient Education © 2019 [employer] ° °

## 2019-03-04 NOTE — Progress Notes (Signed)
Does not need to void prior to discharge per Dr. Edwinna Areola

## 2019-03-04 NOTE — Anesthesia Postprocedure Evaluation (Signed)
Anesthesia Post Note  Patient: Dennis Walsh  Procedure(s) Performed: CYSTOSCOPY BILATERAL RETROGRADE PYELOGRAMS; LEFT LASER LITHORTRIPSY; LEFT URETERAL STENT PLACEMENT (Bilateral )     Patient location during evaluation: PACU Anesthesia Type: General Level of consciousness: awake and alert Pain management: pain level controlled Vital Signs Assessment: post-procedure vital signs reviewed and stable Respiratory status: spontaneous breathing, nonlabored ventilation, respiratory function stable and patient connected to nasal cannula oxygen Cardiovascular status: blood pressure returned to baseline and tachycardic Postop Assessment: no apparent nausea or vomiting Anesthetic complications: no    Last Vitals:  Vitals:   03/04/19 1800 03/04/19 1815  BP: (!) 146/100 (!) 143/94  Pulse: (!) 104 (!) 104  Resp: (!) 26 (!) 22  Temp:    SpO2: 98% 99%    Last Pain:  Vitals:   03/04/19 1745  TempSrc:   PainSc: Asleep                 Cecile Hearing

## 2019-03-04 NOTE — H&P (Signed)
Urology Admission H&P  Chief Complaint: bilateral flank pain  History of Present Illness: Dennis Walsh is a 53yo with a hx of CP who developed flank pain 10-14 days ago. He presented to my office today and CT demonstrated bilateral distal ureteral calculi. No fevers.   Past Medical History:  Diagnosis Date  . Arthritis    bil knees  . Cerebral palsy, quadriplegic (HCC)   . Hearing loss    au  . Mild mental retardation   . Seborrhea    facial   History reviewed. No pertinent surgical history.  Home Medications:  Current Facility-Administered Medications  Medication Dose Route Frequency Provider Last Rate Last Dose  . ceFAZolin (ANCEF) 2-4 GM/100ML-% IVPB           . ceFAZolin (ANCEF) IVPB 2g/100 mL premix  2 g Intravenous 30 min Pre-Op Ronne Binning Mardene Celeste, MD      . lactated ringers infusion   Intravenous Continuous Cecile Hearing, MD 20 mL/hr at 03/04/19 1342     Allergies: No Known Allergies  History reviewed. No pertinent family history. Social History:  reports that he has never smoked. He has never used smokeless tobacco. He reports that he does not drink alcohol or use drugs.  Review of Systems  Genitourinary: Positive for flank pain.  All other systems reviewed and are negative.   Physical Exam:  Vital signs in last 24 hours: Temp:  [98.6 F (37 C)] 98.6 F (37 C) (03/19 1314) Pulse Rate:  [96] 96 (03/19 1314) Resp:  [15] 15 (03/19 1314) BP: (150)/(94) 150/94 (03/19 1314) SpO2:  [99 %] 99 % (03/19 1314) Weight:  [62.1 kg] 62.1 kg (03/19 1316) Physical Exam  Constitutional: He is oriented to person, place, and time. He appears well-developed and well-nourished.  HENT:  Head: Normocephalic and atraumatic.  Eyes: Pupils are equal, round, and reactive to light. EOM are normal.  Neck: Normal range of motion. No thyromegaly present.  Cardiovascular: Normal rate and regular rhythm.  Respiratory: Effort normal. No respiratory distress.  GI: Soft. He exhibits  no distension.  Musculoskeletal: Normal range of motion.        General: No edema.  Neurological: He is alert and oriented to person, place, and time.  Skin: Skin is warm and dry.  Psychiatric: He has a normal mood and affect. His behavior is normal. Judgment and thought content normal.    Laboratory Data:  No results found for this or any previous visit (from the past 24 hour(s)). No results found for this or any previous visit (from the past 240 hour(s)). Creatinine: Recent Labs    02/26/19 1202  CREATININE 1.24   Baseline Creatinine: 1.2  Impression/Assessment:  53yo with bilateral ureteral calculi  Plan:  The risks/benefits/alternatives to bilateral ureteroscopic stone extraction was explained to the patient and family and they understand and wish to proceed with surgery  Wilkie Aye 03/04/2019, 3:44 PM

## 2019-03-04 NOTE — Anesthesia Procedure Notes (Signed)
Procedure Name: Intubation Date/Time: 03/04/2019 4:07 PM Performed by: Wynonia Sours, CRNA Pre-anesthesia Checklist: Patient identified, Emergency Drugs available, Suction available, Patient being monitored and Timeout performed Patient Re-evaluated:Patient Re-evaluated prior to induction Oxygen Delivery Method: Circle system utilized Preoxygenation: Pre-oxygenation with 100% oxygen Induction Type: IV induction Laryngoscope Size: 3 and Glidescope Grade View: Grade I Tube type: Oral Tube size: 7.5 mm Number of attempts: 1 Airway Equipment and Method: Video-laryngoscopy and Rigid stylet Placement Confirmation: ETT inserted through vocal cords under direct vision,  positive ETCO2,  CO2 detector and breath sounds checked- equal and bilateral Secured at: 21 cm Tube secured with: Tape Dental Injury: Teeth and Oropharynx as per pre-operative assessment

## 2019-03-05 ENCOUNTER — Encounter (HOSPITAL_COMMUNITY): Payer: Self-pay | Admitting: Urology

## 2019-03-18 DIAGNOSIS — N201 Calculus of ureter: Secondary | ICD-10-CM | POA: Diagnosis not present

## 2019-04-29 DIAGNOSIS — N201 Calculus of ureter: Secondary | ICD-10-CM | POA: Diagnosis not present

## 2019-06-01 IMAGING — CT CT ABDOMEN AND PELVIS WITHOUT CONTRAST
1 of 2 series · 12 of 32 positions shown, 17 images · non-contrast
Comparison: None.

CLINICAL DATA: 52-year-old male with dark urine for several days,
gross hematuria. Cerebral palsy.

EXAM:
CT ABDOMEN AND PELVIS WITHOUT CONTRAST
TECHNIQUE: Multidetector CT imaging of the abdomen and pelvis was performed
following the standard protocol without IV contrast.

[Series 2: renal standard/full · axial · 0.73mm/px · z∈[-660,-250]mm · 12 of 94 slices shown, 17 images]
[im 6/94  soft-tissue]
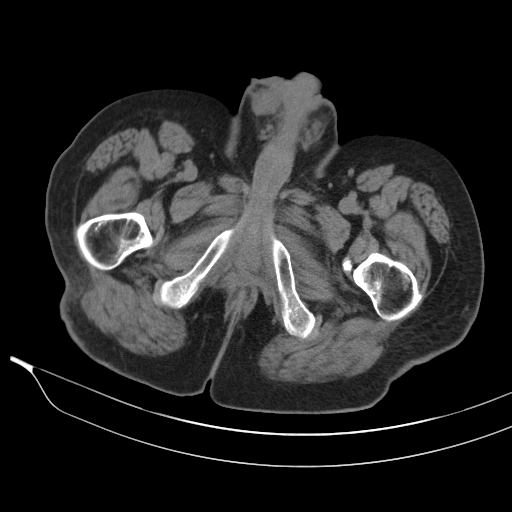
[im 6/94  bone]
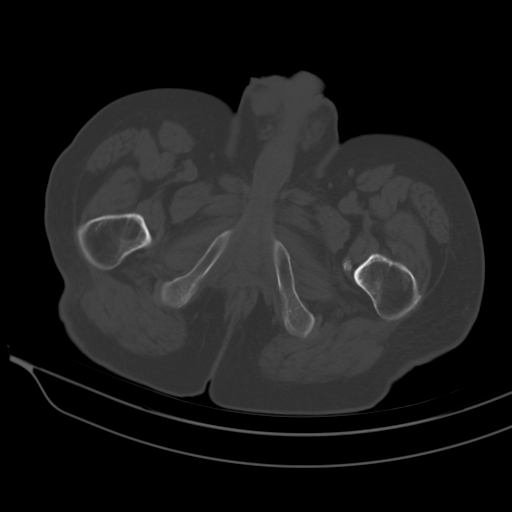
[im 17/94  soft-tissue]
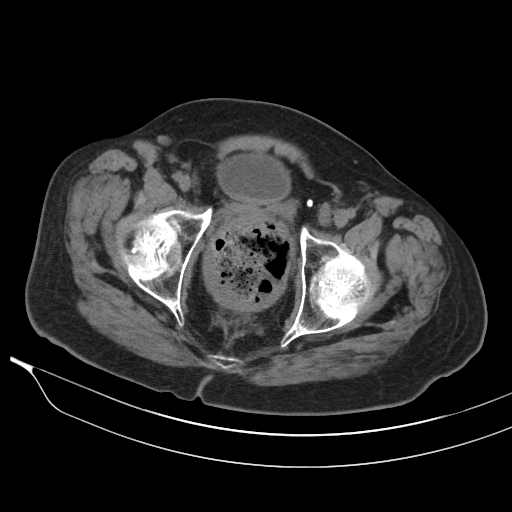
[im 22/94  soft-tissue]
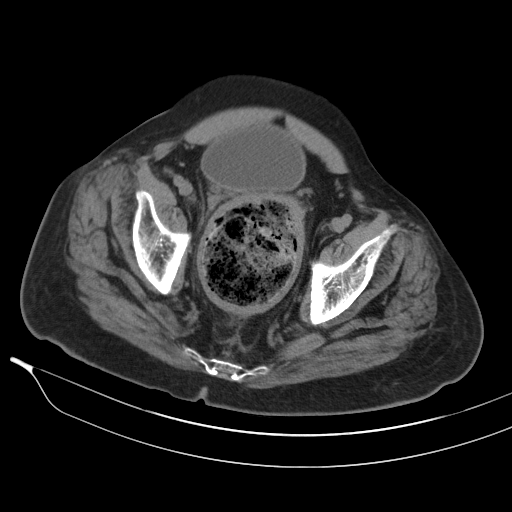
[im 33/94  soft-tissue]
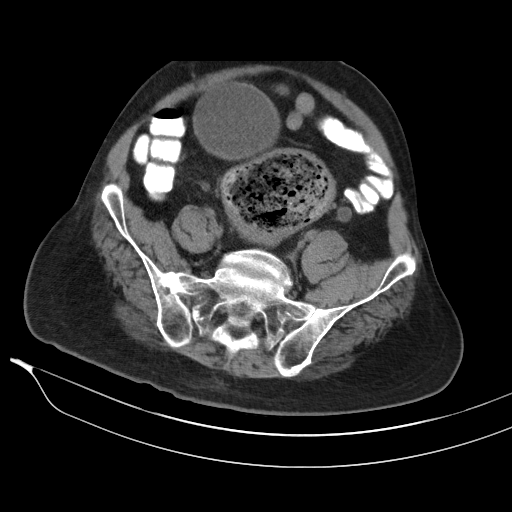
[im 39/94  soft-tissue]
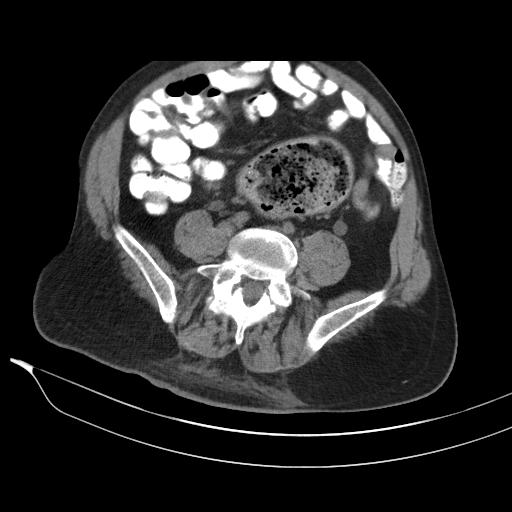
[im 50/94  soft-tissue]
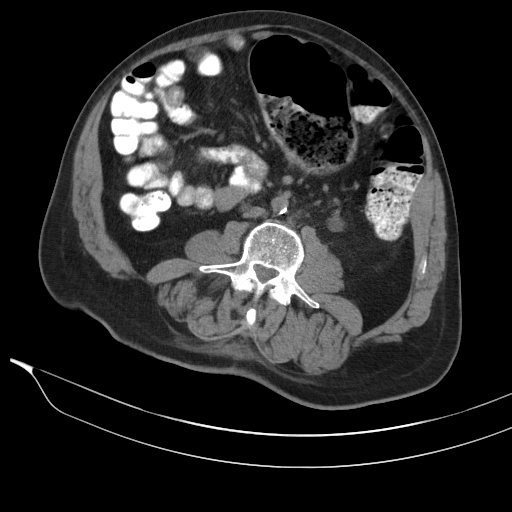
[im 55/94  soft-tissue]
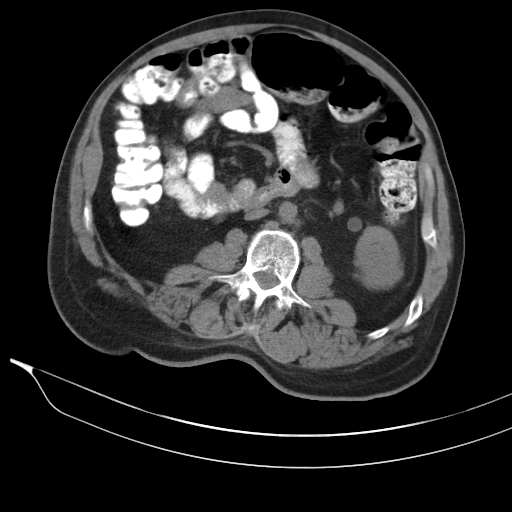
[im 61/94  soft-tissue]
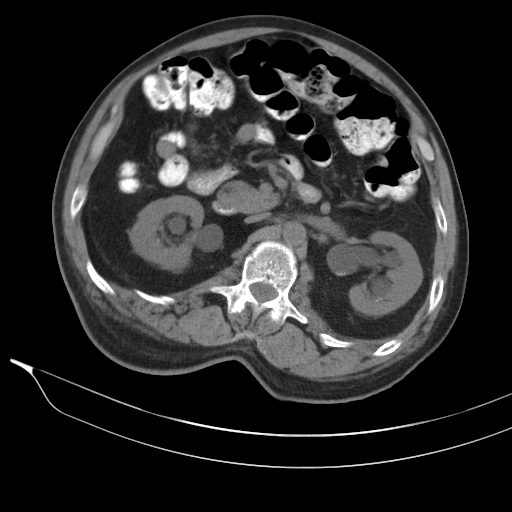
[im 72/94  soft-tissue]
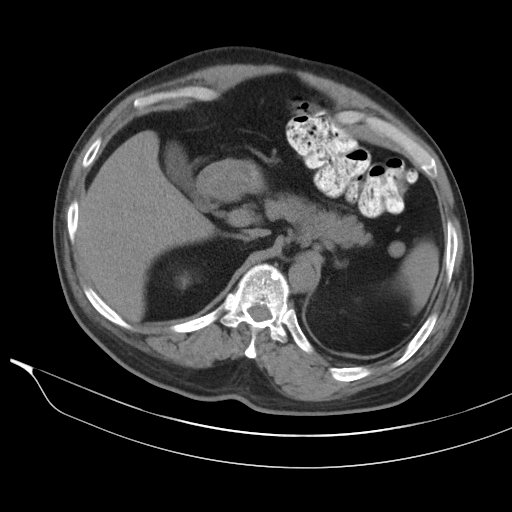
[im 72/94  lung]
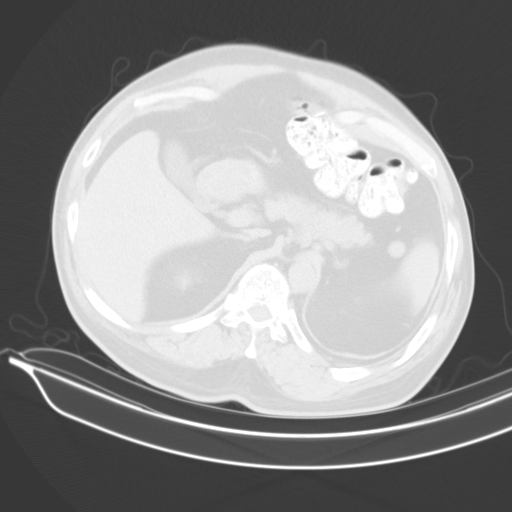
[im 72/94  bone]
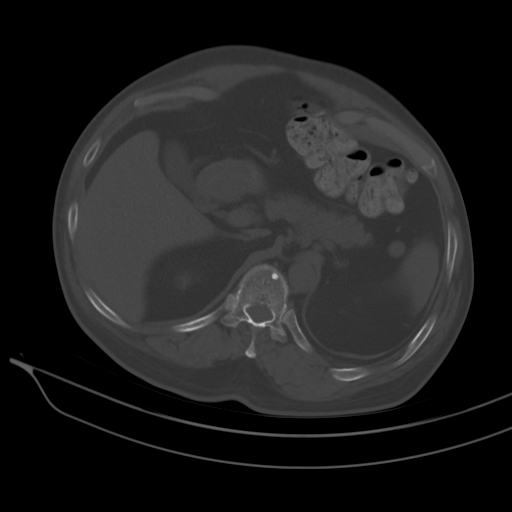
[im 77/94  soft-tissue]
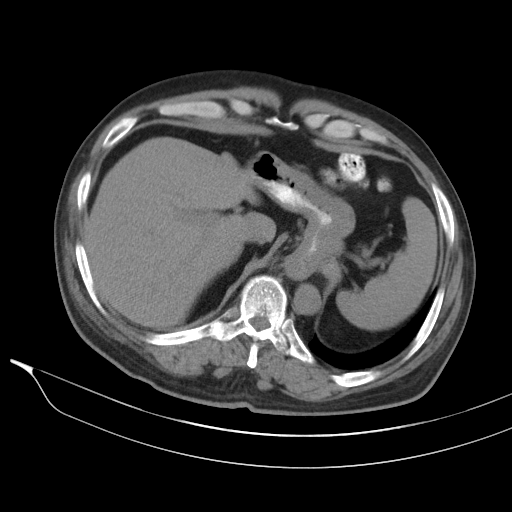
[im 77/94  lung]
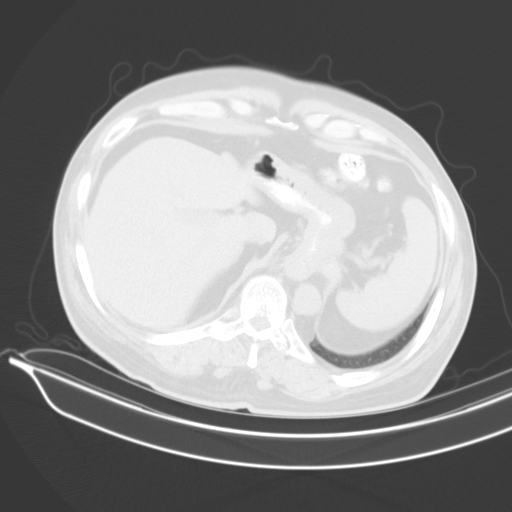
[im 83/94  lung]
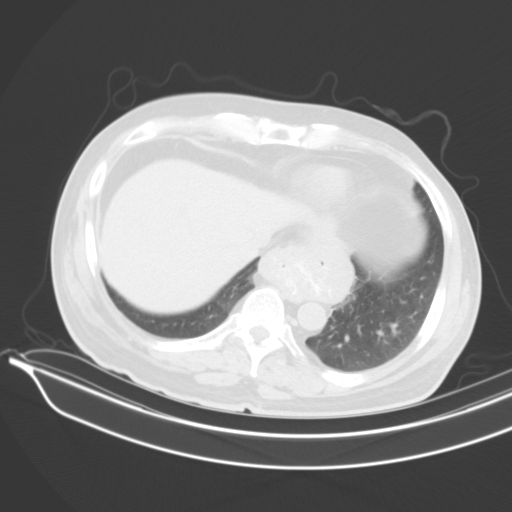
[im 88/94  soft-tissue]
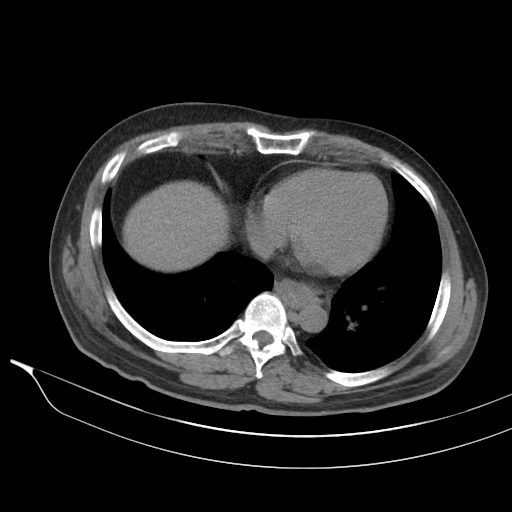
[im 88/94  lung]
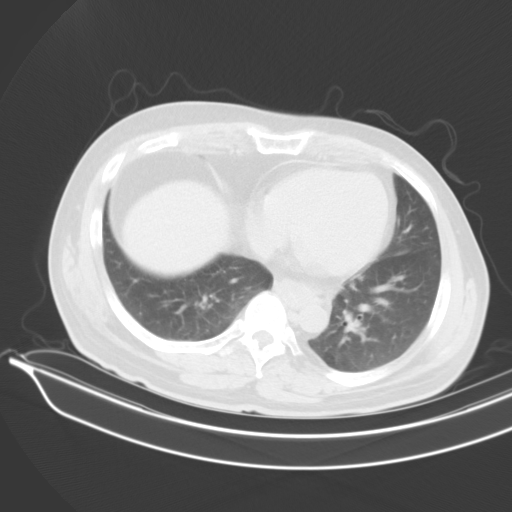

[12 of 32 positions shown; findings below may reference images not displayed]

FINDINGS: Lower chest: Moderate size gastric hiatal hernia. Otherwise
negative. No pericardial or pleural effusion.

Hepatobiliary: Small low-density area in the right hepatic lobe on
series 2, image 21 is nonspecific but is appears benign. Negative
gallbladder.

Pancreas: Negative.

Spleen: Negative.

Adrenals/Urinary Tract: Bilateral hydronephrosis and hydroureter.
Punctate calculi layering in the left renal midpole and dilated
renal pelvis. The left ureter remains dilated into the pelvis where
an oblong 5 x 7 millimeter calculus is identified on coronal image
48 and series 2, image 68 about 3 centimeters proximal to the left
UVJ. Distal to the stone the left ureter has a more normal
appearance.

On the right side the ureter is dilated to the ureterovesical
junction where a smaller oval 4 millimeter calculus is located and
projects just inside the bladder. Superimposed punctate right renal
lower pole calculi.

The urinary bladder is dilated but compressed ventrally from the
stool impacted distal colon. Estimated bladder volume 232
milliliters. No perivesical stranding.

Stomach/Bowel: Oral contrast was administered and has reached the
proximal sigmoid colon. The sigmoid is highly redundant. And from
the mid sigmoid continuing into the pelvis and rectum there is
substantial retained stool (sagittal image 66). Superimposed rectum
and distal sigmoid circumferential wall thickening measuring 6-7
millimeters. Mild perirectal inflammatory stranding posteriorly on
series 2, image 75.

The upstream large bowel is also redundant but nondilated with
contrast mixed with some stool. Negative terminal ileum. The cecum
is on a lax mesentery. It appears a normal appendix is directed
medial from the cecum on coronal image 30.

No dilated small bowel. Gastric hiatal hernia. Intraabdominal
stomach appears negative. The duodenum is a does seem to cross
midline, but proximal small bowel is all in the right abdomen.

No free air, free fluid.

Vascular/Lymphatic: Mild Aortoiliac calcified atherosclerosis.
Vascular patency is not evaluated in the absence of IV contrast.

Reproductive: Negative.

Other: No pelvic free fluid.

Two small nodular, partially calcified foci seem to have internal
fat density and are favored to be chronic fat necrosis. These are on
series 2, image 47 subjacent to the ventral right abdominal wall,
and in the right lower quadrant on image 64 lateral to the cecum.
These are felt to be inconsequential.

Musculoskeletal: Chronic L5 pars fractures. Trace associated L5-S1
spondylolisthesis. Osteopenia. No acute osseous abnormality
identified.
IMPRESSION: 1. Acute bilateral obstructive uropathy with hydronephrosis and
hydroureter.
- 5 x 7 mm calculus in the distal left ureter about 3 cm proximal to
the left UVJ.
- 4 mm calculus at the right UVJ.
- punctate layering calculi in the left renal midpole, left renal
pelvis, and right lower pole.
2. Superimposed rectosigmoid fecal impaction suspected, and it is
unclear whether this might be impairing bladder emptying (see #3).
Redundant large bowel with abundant stool in the rectosigmoid and
circumferential wall thickening.
3. Distended urinary bladder, estimated bladder volume 232 mL.
4. Moderate size gastric hiatal hernia.
5. Chronic L5 pars fractures with trace L5-S1 spondylolisthesis.

## 2019-07-23 DIAGNOSIS — D72829 Elevated white blood cell count, unspecified: Secondary | ICD-10-CM

## 2019-09-21 ENCOUNTER — Telehealth (INDEPENDENT_AMBULATORY_CARE_PROVIDER_SITE_OTHER): Payer: Medicare Other | Admitting: Internal Medicine

## 2019-09-21 ENCOUNTER — Encounter: Payer: Self-pay | Admitting: Internal Medicine

## 2019-09-21 DIAGNOSIS — G800 Spastic quadriplegic cerebral palsy: Secondary | ICD-10-CM | POA: Diagnosis not present

## 2019-09-21 NOTE — Telephone Encounter (Signed)
Patient has CP with spastic quadriplegia. Have received document from caseworker to be reviewed and signed regarding need for new shower chair and motorized lift. Pt is not ambulatory. Has issues with speech, has problems showering with spastic quadriplegia requiring a lift and a shower chair.  Document has been personally reviewed by me and signed.

## 2019-09-22 ENCOUNTER — Telehealth: Payer: Self-pay | Admitting: Internal Medicine

## 2019-09-22 NOTE — Telephone Encounter (Signed)
Faxed signed order acknowledging medical necessity for equipment to [facility] fax number [phone], phone [phone] attention Micah Flesher.

## 2019-09-22 NOTE — Telephone Encounter (Signed)
Personally reviewed and signed request.

## 2019-10-01 ENCOUNTER — Telehealth: Payer: Self-pay | Admitting: Internal Medicine

## 2019-10-01 NOTE — Telephone Encounter (Signed)
Called and spoke with Abagail Kitchens (dad), to let him know we had gotten paperwork for his son to get new wheel chair, and its states that we need to do a face to face office visit for him to get one. He is going to contact [employer] to call me and set up appointment.

## 2019-10-07 ENCOUNTER — Encounter: Payer: Self-pay | Admitting: Internal Medicine

## 2019-10-07 ENCOUNTER — Ambulatory Visit (INDEPENDENT_AMBULATORY_CARE_PROVIDER_SITE_OTHER): Payer: Medicare Other | Admitting: Internal Medicine

## 2019-10-07 ENCOUNTER — Other Ambulatory Visit: Payer: Self-pay

## 2019-10-07 VITALS — BP 110/80 | HR 86 | Temp 98.0°F

## 2019-10-07 DIAGNOSIS — Z23 Encounter for immunization: Secondary | ICD-10-CM | POA: Diagnosis not present

## 2019-10-07 DIAGNOSIS — G800 Spastic quadriplegic cerebral palsy: Secondary | ICD-10-CM

## 2019-10-07 DIAGNOSIS — H9193 Unspecified hearing loss, bilateral: Secondary | ICD-10-CM

## 2019-10-07 DIAGNOSIS — M7918 Myalgia, other site: Secondary | ICD-10-CM

## 2019-10-07 NOTE — Patient Instructions (Addendum)
Flu vaccine given.  Forms completed for power wheelchair.  Continue current medications.  He had complete physical examination March 2020 and will be due for another exam at that time.

## 2019-10-07 NOTE — Telephone Encounter (Signed)
Appointment scheduled / Documents filled out and signed, all accompaning documents given to them at the time of office visit.

## 2019-10-07 NOTE — Progress Notes (Signed)
   Subjective:    Patient ID: Dennis Bosch., male    DOB: January 27, 1966, 53 y.o.   MRN: [id number]  HPI 53 year old spastic quadriplegia, speech impediment cerebral palsy in today for face-to-face encounter regarding need for power wheelchair.  He has not had one in the number of years.  His current wheelchair is in disrepair and cannot easily be repaired.  I have reviewed and signed forms for the new power wheelchair.  Seen today with his father present.  He is admitted with acute distress.  Vaccine given today.  He lives in a group home here in [place].  He does not smoke.  He is pleasant and cooperative.  He is nonambulatory.  He has musculoskeletal pain related to osteoarthritis and being in wheelchair which is treated with nonsteroidal anti-inflammatory medications.  He has no seizure disorder.  In March he was hospitalized for cystoscopy, left laser lithotripsy of left ureteral stone with stent placement.  He has done well since that time.  Review of Systems see above     Objective:   Physical Exam Blood pressure 110/80 pulse 86 pulse oximetry 99% Skin warm and dry.  Chest is clear to auscultation.  Cardiac exam regular rate and rhythm normal S1 and S2.  No lower extremity edema.  Significant spastic quadriplegia and speech impediment.  Wheelchair observed to be in significant disrepair      Assessment & Plan:  Spastic quadriplegia  Speech impediment  History of kidney stone  Hearing impairment  Musculoskeletal pain  Need for new power wheel chair  Plan: It is my professional opinion that he is in need of a new power wheelchair. The one he has is lacking a foot petal on the left and the chair itself has significant wear and tear and no longer serves him well.  Forms completed and signed.  Flu vaccine given today.

## 2019-10-14 NOTE — Telephone Encounter (Signed)
Faxed 31 pages to [facility] at [phone], OV notes, face to face 10-07-19, OV 03/03/19, OV 02/26/19 and 02/23/18, written order, PT eval signed and detail sheet signed.

## 2019-10-26 DIAGNOSIS — N201 Calculus of ureter: Secondary | ICD-10-CM | POA: Diagnosis not present

## 2019-12-06 ENCOUNTER — Telehealth: Payer: Self-pay | Admitting: Internal Medicine

## 2019-12-06 NOTE — Telephone Encounter (Signed)
[place]  Claiborne Billings called from the group home where [name] stays, to say that he had been exposed to COVID thru one of the workers and the group home now wants everyone to be tested within 5 days of exposure. 12/02/19.

## 2019-12-06 NOTE — Telephone Encounter (Signed)
Called back and spoke with Dennis Walsh, he is going to call the number and get appointment, he did say [name]'s dad is aware, he had spoke with him last week. They have 6 residents in the group home. Only the one case at this time.

## 2019-12-06 NOTE — Telephone Encounter (Signed)
Are they arranging for the testing? Is his father aware?

## 2019-12-07 ENCOUNTER — Other Ambulatory Visit: Payer: Self-pay | Admitting: Internal Medicine

## 2019-12-07 NOTE — Telephone Encounter (Signed)
Patient has COVID test scheduled at [facility] site

## 2019-12-08 ENCOUNTER — Ambulatory Visit: Payer: Medicare Other | Attending: Internal Medicine

## 2019-12-08 DIAGNOSIS — Z20822 Contact with and (suspected) exposure to covid-19: Secondary | ICD-10-CM

## 2019-12-10 LAB — NOVEL CORONAVIRUS, NAA: SARS-CoV-2, NAA: NOT DETECTED

## 2019-12-12 ENCOUNTER — Telehealth: Payer: Self-pay | Admitting: Internal Medicine

## 2019-12-12 NOTE — Telephone Encounter (Signed)
Patient's caretaker is calling to receive the patient's negative COVID test results. Caretaker expressed understanding.

## 2019-12-23 ENCOUNTER — Other Ambulatory Visit: Payer: Self-pay | Admitting: Internal Medicine

## 2019-12-27 DIAGNOSIS — Z23 Encounter for immunization: Secondary | ICD-10-CM | POA: Diagnosis not present

## 2020-01-18 ENCOUNTER — Other Ambulatory Visit: Payer: Self-pay | Admitting: Internal Medicine

## 2020-01-23 DIAGNOSIS — Z23 Encounter for immunization: Secondary | ICD-10-CM | POA: Diagnosis not present

## 2020-02-10 ENCOUNTER — Other Ambulatory Visit: Payer: Self-pay | Admitting: Internal Medicine

## 2020-03-07 DIAGNOSIS — G808 Other cerebral palsy: Secondary | ICD-10-CM | POA: Diagnosis not present

## 2020-03-07 DIAGNOSIS — H9193 Unspecified hearing loss, bilateral: Secondary | ICD-10-CM | POA: Diagnosis not present

## 2020-03-31 ENCOUNTER — Other Ambulatory Visit: Payer: Medicare Other | Admitting: Internal Medicine

## 2020-03-31 ENCOUNTER — Other Ambulatory Visit: Payer: Self-pay

## 2020-03-31 DIAGNOSIS — H9193 Unspecified hearing loss, bilateral: Secondary | ICD-10-CM

## 2020-03-31 DIAGNOSIS — Z125 Encounter for screening for malignant neoplasm of prostate: Secondary | ICD-10-CM | POA: Diagnosis not present

## 2020-03-31 DIAGNOSIS — L219 Seborrheic dermatitis, unspecified: Secondary | ICD-10-CM

## 2020-03-31 DIAGNOSIS — M7918 Myalgia, other site: Secondary | ICD-10-CM | POA: Diagnosis not present

## 2020-03-31 DIAGNOSIS — G800 Spastic quadriplegic cerebral palsy: Secondary | ICD-10-CM | POA: Diagnosis not present

## 2020-03-31 DIAGNOSIS — E559 Vitamin D deficiency, unspecified: Secondary | ICD-10-CM

## 2020-03-31 DIAGNOSIS — Z0000 Encounter for general adult medical examination without abnormal findings: Secondary | ICD-10-CM

## 2020-03-31 DIAGNOSIS — Z13220 Encounter for screening for lipoid disorders: Secondary | ICD-10-CM | POA: Diagnosis not present

## 2020-03-31 DIAGNOSIS — E7800 Pure hypercholesterolemia, unspecified: Secondary | ICD-10-CM | POA: Diagnosis not present

## 2020-03-31 DIAGNOSIS — L719 Rosacea, unspecified: Secondary | ICD-10-CM | POA: Diagnosis not present

## 2020-04-01 LAB — COMPLETE METABOLIC PANEL WITH GFR
AG Ratio: 2 (calc) (ref 1.0–2.5)
ALT: 15 U/L (ref 9–46)
AST: 19 U/L (ref 10–35)
Albumin: 4.6 g/dL (ref 3.6–5.1)
Alkaline phosphatase (APISO): 65 U/L (ref 35–144)
BUN: 21 mg/dL (ref 7–25)
CO2: 28 mmol/L (ref 20–32)
Calcium: 9.8 mg/dL (ref 8.6–10.3)
Chloride: 102 mmol/L (ref 98–110)
Creat: 0.85 mg/dL (ref 0.70–1.33)
GFR, Est African American: 115 mL/min/{1.73_m2} (ref >=60)
GFR, Est Non African American: 99 mL/min/{1.73_m2} (ref >=60)
Globulin: 2.3 g/dL (calc) (ref 1.9–3.7)
Glucose, Bld: 91 mg/dL (ref 65–99)
Potassium: 4.3 mmol/L (ref 3.5–5.3)
Sodium: 142 mmol/L (ref 135–146)
Total Bilirubin: 0.5 mg/dL (ref 0.2–1.2)
Total Protein: 6.9 g/dL (ref 6.1–8.1)

## 2020-04-01 LAB — CBC WITH DIFFERENTIAL/PLATELET
Absolute Monocytes: 422 cells/uL (ref 200–950)
Basophils Absolute: 19 cells/uL (ref 0–200)
Basophils Relative: 0.3 %
Eosinophils Absolute: 321 cells/uL (ref 15–500)
Eosinophils Relative: 5.1 %
HCT: 48 % (ref 38.5–50.0)
Hemoglobin: 16.4 g/dL (ref 13.2–17.1)
Lymphs Abs: 2016 cells/uL (ref 850–3900)
MCH: 32.4 pg (ref 27.0–33.0)
MCHC: 34.2 g/dL (ref 32.0–36.0)
MCV: 94.9 fL (ref 80.0–100.0)
MPV: 13.3 fL — ABNORMAL HIGH (ref 7.5–12.5)
Monocytes Relative: 6.7 %
Neutro Abs: 3522 cells/uL (ref 1500–7800)
Neutrophils Relative %: 55.9 %
Platelets: 166 10*3/uL (ref 140–400)
RBC: 5.06 10*6/uL (ref 4.20–5.80)
RDW: 13.5 % (ref 11.0–15.0)
Total Lymphocyte: 32 %
WBC: 6.3 10*3/uL (ref 3.8–10.8)

## 2020-04-01 LAB — PSA: PSA: 0.2 ng/mL (ref <=4.0)

## 2020-04-01 LAB — LIPID PANEL
Cholesterol: 196 mg/dL (ref <200)
HDL: 45 mg/dL (ref >=40)
LDL Cholesterol (Calc): 131 mg/dL (calc) — ABNORMAL HIGH
Non-HDL Cholesterol (Calc): 151 mg/dL (calc) — ABNORMAL HIGH (ref <130)
Total CHOL/HDL Ratio: 4.4 (calc) (ref <5.0)
Triglycerides: 98 mg/dL (ref <150)

## 2020-04-03 ENCOUNTER — Encounter: Payer: Self-pay | Admitting: Internal Medicine

## 2020-04-03 ENCOUNTER — Other Ambulatory Visit: Payer: Self-pay

## 2020-04-03 ENCOUNTER — Ambulatory Visit (INDEPENDENT_AMBULATORY_CARE_PROVIDER_SITE_OTHER): Payer: Medicare Other | Admitting: Internal Medicine

## 2020-04-03 VITALS — BP 100/60 | HR 102 | Temp 98.3°F

## 2020-04-03 DIAGNOSIS — E7800 Pure hypercholesterolemia, unspecified: Secondary | ICD-10-CM

## 2020-04-03 DIAGNOSIS — G800 Spastic quadriplegic cerebral palsy: Secondary | ICD-10-CM

## 2020-04-03 DIAGNOSIS — Z8719 Personal history of other diseases of the digestive system: Secondary | ICD-10-CM

## 2020-04-03 DIAGNOSIS — L219 Seborrheic dermatitis, unspecified: Secondary | ICD-10-CM

## 2020-04-03 DIAGNOSIS — Z0000 Encounter for general adult medical examination without abnormal findings: Secondary | ICD-10-CM | POA: Diagnosis not present

## 2020-04-03 DIAGNOSIS — M7918 Myalgia, other site: Secondary | ICD-10-CM

## 2020-04-03 DIAGNOSIS — Z029 Encounter for administrative examinations, unspecified: Secondary | ICD-10-CM

## 2020-04-03 DIAGNOSIS — L719 Rosacea, unspecified: Secondary | ICD-10-CM

## 2020-04-03 DIAGNOSIS — H9193 Unspecified hearing loss, bilateral: Secondary | ICD-10-CM

## 2020-04-03 DIAGNOSIS — Z87442 Personal history of urinary calculi: Secondary | ICD-10-CM

## 2020-04-03 NOTE — Patient Instructions (Signed)
It was a pleasure to see you today.  Follow-up with urologist in the near future.  Continue current medications.  Follow-up in 1 year or as needed.

## 2020-04-03 NOTE — Progress Notes (Signed)
Subjective:    Patient ID: Dennis Walsh., male    DOB: 08-24-1966, 54 y.o.   MRN: [id number]  HPI 54 year old Male for health maintenance exam and evaluation of all issues.  He has a history of cerebral palsy with spastic quadriplegia.  In March 2020 he was found to have an elevated serum creatinine of 2.01 in the year previously had been 0.76.  Subsequently had CT of the abdomen and pelvis without contrast and was found to have bilateral hydronephrosis and hydroureter.  Had a 5 x 7 mm calculus in the distal left ureter about 3 cm proximal to the left UVJ.  Had a 4 mm calculus at the right UVJ.  Had fecal impaction.  Was seen by urologist and was taken to surgery where he had cystoscopy and laser lithotripsy.  He has follow-up appointment soon with urologist but has done well over the past year.  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  He has a new motorized wheelchair.  Having some back issues with new chair according to his father.  He is on Paxil for agitation.  He has stayed on this for a number of years and it seems to help.  History of seborrhea of the face treated with Nizoral cream.  He has history of  constipation and takes fiber and MiraLAX.  He tends to have a large bowel movement every 3 days or so.  History of hearing loss and speech impediment.  Sometimes he is difficult to understand.  Father says there has been no significant change in his health.  He did not contract COVID-19.  Appetite is good.  No known drug allergies.  Adhesive capsulitis right shoulder 2009.  He has never lived independently.  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  He has to be fed because of significant spasticity and decreased range of motion of the right upper extremity.  He is right-handed.  Mother says this became more of an issue after his cervical neck surgery in 2008.  Social history: Parents  are divorced.  He has no siblings.  He does not smoke or use alcohol or illicit drugs.  He enjoys watching TV and basketball.  His affect is generally pleasant.  His father is his guardian and his mother has power of attorney as I understand it.  In September 2010 he had lengthening of biceps tendon with lengthening of brachioradialis on the right.  He had excision of his distal clavicle on the right and denervation of the right upper extremity performed by orthopedist at [facility].  He subsequently had tachycardia and hypoxia postoperatively and was thought to have aspirated.  Chest x-ray showed bibasilar opacities.  CT was negative for pulmonary emboli.  He was treated with antibiotics and improved.  History of spinal stenosis of the C-spine with decompression and fusion from C2-T1 for cervical spondylosis with myelopathy in April 2008.    Review of Systems no new complaints-see above     Objective:   Physical Exam Blood pressure 100/60, pulse 102, temperature 98.3 degrees orally pulse oximetry 99%  Skin: He has acne rosacea and seborrheic dermatitis of the face.  Nodes: None in neck.  TMs checked.  Pharynx clear.  Chest is clear to auscultation.  Cardiac exam regular rate and rhythm normal S1 and S2.  Abdomen is distended and he is likely constipated.  No tenderness.  No  lower extremity edema.  He has spastic quadriplegia.  Speech impediment noted.  He has hearing loss.       Assessment & Plan:  Spastic quadriplegia  Cerebral palsy  Hearing loss  Acne rosacea  Seborrheic dermatitis of face  History of constipation  History of bilateral renal calculi obstructing ureters and causing acute renal failure.  Had urology procedure to remove stones and has done well over the past year and has upcoming appointment with Dr. Ronne Binning.  His creatinine is normal at 0.85  Elevated LDL cholesterol which is new from last year when it was 98.  May be due to dietary  issues over the past year with pandemic.  Has previously been normal.  Continue to monitor.  Health maintenance he had a negative QuantiFERON test in March 2020 to screen for tuberculosis and that should be good for 3 years.  Father says he has had COVID-19 immunizations.  Tetanus immunization given in 2020.  He has had pneumonia 13 and pneumococcal 23 vaccines.  He gets annual flu vaccine.  Plan: Return in 1 year or as needed.  Forms completed for group home as requested.  Subjective:   Patient presents for Medicare Annual/Subsequent preventive examination.  Review Past Medical/Family/Social: See above   Risk Factors  Current exercise habits: Unable to exercise due to severe COPD Dietary issues discussed: Yes  Cardiac risk factors: Elevated LDL cholesterol  Depression Screen this was not done.  Father reports he is affect is stable (Note: if answer to either of the following is "Yes", a more complete depression screening is indicated)   Over the past two weeks, have you felt down, depressed or hopeless? No  Over the past two weeks, have you felt little interest or pleasure in doing things? No Have you lost interest or pleasure in daily life? No Do you often feel hopeless? No Do you cry easily over simple problems? No   Activities of Daily Living  In your present state of health, do you have any difficulty performing the following activities?:   Driving? Not driving  Managing money?  Does not manage money Feeding yourself?  Requires assistance due to spastic quadriplegia Getting from bed to chair?  Yes Climbing a flight of stairs?  Yes Preparing food and eating?:  Lives in group home with meals are prepared Bathing or showering?  Needs assistance Getting dressed: Needs assistance Getting to the toilet?  Needs assistance Using the toilet: Needs assistance Moving around from place to place: Confined to bed or wheelchair In the past year have you fallen or had a near fall?:Not  asked in a group home has not reported a fall Are you sexually active? No  Do you have more than one partner? No   Hearing Difficulties: No  Do you often ask people to speak up or repeat themselves?  Yes  do you experience ringing or noises in your ears? Not asked  Do you have difficulty understanding soft or whispered voices? No  Do you feel that you have a problem with memory? Not asked Do you often misplace items? Not asked    Home Safety:  Do you have a smoke alarm at your residence? Yes Do you have grab bars in the bathroom?  None asked-lives in group home Do you have throw rugs in your house?  Not asked   Cognitive Testing -not tested Alert? Yes Normal Appearance-confined to motorized wheelchair with spastic quadriplegia Oriented to person? Yes Place? Yes  Time?  Not tested Recall  of three objects?  Not tested Can perform simple calculations?  Not tested Displays appropriate judgment?Yes  Can read the correct time from a watch face?  Not tested  List the Names of Other Physician/Practitioners you currently use:  See referral list for the physicians patient is currently seeing.  Dr. Alyson Ingles, urologist  Review of Systems: See above   Objective:     General appearance: Appears younger than stated age Head: Normocephalic, without obvious abnormality, atraumatic  Eyes: conj clear, EOMi PEERLA  Ears: normal TM's and external ear canals both ears  Nose: Nares normal. Septum midline. Mucosa normal. No drainage or sinus tenderness.  Throat: lips, mucosa, and tongue normal; teeth and gums normal  Neck: no adenopathy, no carotid bruit, no JVD, supple, symmetrical, trachea midline and thyroid not enlarged, symmetric, no tenderness/mass/nodules  No CVA tenderness.  Lungs: clear to auscultation bilaterally  Heart: regular rate and rhythm, S1, S2 normal, no murmur, click, rub or gallop  Abdomen: Distended likely constipated, non-tender; bowel sounds normal; no masses, no  organomegaly  Skin: Skin: Face has acne rosacea and seborrheic dermatitis Lymph nodes: Cervical, supraclavicular, and axillary nodes normal.  Neurologic: Spastic quadriplegia and speech impediment with hearing loss normal coordination and gait  Psych: He is alert and cooperative mood appear stable.    Assessment:    Annual wellness medicare exam   Plan:    During the course of the visit the patient was educated and counseled about appropriate screening and preventive services including:   Annual flu vaccine  Tetanus immunization is up-to-date       Patient Instructions (the written plan) was given to the patient.  Medicare Attestation  I have personally reviewed:  The patient's medical and social history  Their use of alcohol, tobacco or illicit drugs  Their current medications and supplements  The patient's functional ability including ADLs,fall risks, home safety risks, cognitive, and hearing and visual impairment  Diet and physical activities  Evidence for depression or mood disorders  The patient's weight, height, BMI, and visual acuity have been recorded in the chart. I have made referrals, counseling, and provided education to the patient based on review of the above and I have provided the patient with a written personalized care plan for preventive services.

## 2020-04-25 DIAGNOSIS — N201 Calculus of ureter: Secondary | ICD-10-CM | POA: Diagnosis not present

## 2020-09-18 ENCOUNTER — Telehealth: Payer: Self-pay | Admitting: Internal Medicine

## 2020-09-18 NOTE — Telephone Encounter (Signed)
Please call his father and tell us how best to get a urine specimen. Is urine dark just because he was not eating or drinking?  He needs urine specimen and OV. Could he have another kidney stone?

## 2020-09-18 NOTE — Telephone Encounter (Signed)
They called from facility to say that Dennis Walsh was complaining of back pain last week and today and his urine is dark. She wanted to know if they could get a specimen there or does he need to come to office to be seen? She also said he only had gone once while she was there on Friday.

## 2020-09-18 NOTE — Telephone Encounter (Addendum)
Called and spoke with Ty (dad) he had just left patient, he said that patient was feeling good, no apparent pain, was hungry. He did wander about kidney stone to. He suggested calling group home and working out best way to get specimen, because they would have to bring to office.

## 2020-09-18 NOTE — Telephone Encounter (Signed)
Spoke with Group home they were coming by to pick up specimen cup before lunch and bring back after lunch.

## 2020-09-19 ENCOUNTER — Other Ambulatory Visit: Payer: Self-pay

## 2020-09-19 ENCOUNTER — Ambulatory Visit (INDEPENDENT_AMBULATORY_CARE_PROVIDER_SITE_OTHER): Payer: Medicare Other | Admitting: Internal Medicine

## 2020-09-19 DIAGNOSIS — R82998 Other abnormal findings in urine: Secondary | ICD-10-CM | POA: Diagnosis not present

## 2020-09-19 DIAGNOSIS — Z87442 Personal history of urinary calculi: Secondary | ICD-10-CM

## 2020-09-19 DIAGNOSIS — G800 Spastic quadriplegic cerebral palsy: Secondary | ICD-10-CM | POA: Diagnosis not present

## 2020-09-19 LAB — POCT URINALYSIS DIPSTICK
Appearance: NEGATIVE
Bilirubin, UA: 1
Blood, UA: NEGATIVE
Glucose, UA: NEGATIVE
Ketones, UA: NEGATIVE
Leukocytes, UA: NEGATIVE
Nitrite, UA: NEGATIVE
Odor: NEGATIVE
Protein, UA: NEGATIVE
Spec Grav, UA: 1.02 (ref 1.010–1.025)
Urobilinogen, UA: 0.2 E.U./dL
pH, UA: 6.5 (ref 5.0–8.0)

## 2020-09-19 NOTE — Progress Notes (Signed)
   Subjective:    Patient ID: Dennis Walsh., male    DOB: 30-Sep-1966, 54 y.o.   MRN: [id number]  HPI Patient has history of kidney stone that started with flank pain in March 2020. CT demonstrated bilateral distal ureteral calculi.Dr. Ronne Binning, urologist , for scopic stone extraction in March 2020.  He has done well since that time.  Patient has spastic quadriplegia and is confined to a wheelchair.  He lives in a group home.  Recently complained of some flank pain.  Staff noted dark urine.  Urine sample was brought today by staff at group home.. Patient came also but stayed in his handicapped [place] Niece outside.  He has spastic quadriplegia and is confined to a wheelchair.   Is afebrile.  No vomiting.  Continues to have appetite.  He is not complaining of significant flank pain today.    Review of Systems see above     Objective:   Physical Exam  Dipstick UA shows no occult blood.  Urine culture and urine microscopic specimen sent.  Will await these results.  Specific gravity is elevated at 1.020.  He may need to drink more fluids.      Assessment & Plan:  Dark urine  History of bilateral ureteral calculi  Spastic quadriplegia  Cerebral palsy  Plan: Await urine results from lab.  Encourage patient to consume more fluids.

## 2020-09-19 NOTE — Telephone Encounter (Signed)
The brought urine sample back

## 2020-09-19 NOTE — Patient Instructions (Signed)
Urine was sent for culture and microscopic.  Will await results before considering treatment.  He is in no acute distress.  However his specific gravity is elevated and he needs to drink more fluids.

## 2020-09-20 LAB — URINE CULTURE
MICRO NUMBER:: 11033122
SPECIMEN QUALITY:: ADEQUATE

## 2020-09-20 LAB — URINALYSIS, MICROSCOPIC ONLY
Bacteria, UA: NONE SEEN /HPF
Hyaline Cast: NONE SEEN /LPF
RBC / HPF: NONE SEEN /HPF (ref 0–2)
Squamous Epithelial / HPF: NONE SEEN /HPF (ref <=5)
WBC, UA: NONE SEEN /HPF (ref 0–5)

## 2020-10-31 DIAGNOSIS — N200 Calculus of kidney: Secondary | ICD-10-CM | POA: Diagnosis not present

## 2020-12-29 ENCOUNTER — Telehealth: Payer: Self-pay | Admitting: Internal Medicine

## 2020-12-29 NOTE — Telephone Encounter (Signed)
Completed and mailed form back to   [facility] [place] [facility] [address] [place], [place] [address]  Attn: Jan Jonus

## 2021-02-09 ENCOUNTER — Telehealth: Payer: Self-pay | Admitting: Internal Medicine

## 2021-02-09 DIAGNOSIS — Z029 Encounter for administrative examinations, unspecified: Secondary | ICD-10-CM

## 2021-02-09 NOTE — Telephone Encounter (Signed)
Faxed completed signed orders to [facility] [phone], Phone [phone]

## 2021-04-03 ENCOUNTER — Other Ambulatory Visit: Payer: Medicare Other | Admitting: Internal Medicine

## 2021-04-03 ENCOUNTER — Other Ambulatory Visit: Payer: Self-pay

## 2021-04-03 DIAGNOSIS — M7918 Myalgia, other site: Secondary | ICD-10-CM

## 2021-04-03 DIAGNOSIS — Z87442 Personal history of urinary calculi: Secondary | ICD-10-CM

## 2021-04-03 DIAGNOSIS — H9193 Unspecified hearing loss, bilateral: Secondary | ICD-10-CM

## 2021-04-03 DIAGNOSIS — Z125 Encounter for screening for malignant neoplasm of prostate: Secondary | ICD-10-CM

## 2021-04-03 DIAGNOSIS — L219 Seborrheic dermatitis, unspecified: Secondary | ICD-10-CM

## 2021-04-03 DIAGNOSIS — Z0000 Encounter for general adult medical examination without abnormal findings: Secondary | ICD-10-CM

## 2021-04-03 DIAGNOSIS — E7800 Pure hypercholesterolemia, unspecified: Secondary | ICD-10-CM

## 2021-04-03 DIAGNOSIS — Z8719 Personal history of other diseases of the digestive system: Secondary | ICD-10-CM

## 2021-04-03 DIAGNOSIS — G800 Spastic quadriplegic cerebral palsy: Secondary | ICD-10-CM

## 2021-04-04 LAB — LIPID PANEL
Cholesterol: 199 mg/dL (ref <200)
HDL: 47 mg/dL (ref >=40)
LDL Cholesterol (Calc): 130 mg/dL (calc) — ABNORMAL HIGH
Non-HDL Cholesterol (Calc): 152 mg/dL (calc) — ABNORMAL HIGH (ref <130)
Total CHOL/HDL Ratio: 4.2 (calc) (ref <5.0)
Triglycerides: 117 mg/dL (ref <150)

## 2021-04-04 LAB — COMPLETE METABOLIC PANEL WITH GFR
AG Ratio: 2 (calc) (ref 1.0–2.5)
ALT: 18 U/L (ref 9–46)
AST: 20 U/L (ref 10–35)
Albumin: 4.5 g/dL (ref 3.6–5.1)
Alkaline phosphatase (APISO): 66 U/L (ref 35–144)
BUN: 23 mg/dL (ref 7–25)
CO2: 27 mmol/L (ref 20–32)
Calcium: 9.8 mg/dL (ref 8.6–10.3)
Chloride: 104 mmol/L (ref 98–110)
Creat: 0.83 mg/dL (ref 0.70–1.33)
GFR, Est African American: 116 mL/min/{1.73_m2} (ref >=60)
GFR, Est Non African American: 100 mL/min/{1.73_m2} (ref >=60)
Globulin: 2.2 g/dL (calc) (ref 1.9–3.7)
Glucose, Bld: 86 mg/dL (ref 65–99)
Potassium: 5.1 mmol/L (ref 3.5–5.3)
Sodium: 144 mmol/L (ref 135–146)
Total Bilirubin: 0.6 mg/dL (ref 0.2–1.2)
Total Protein: 6.7 g/dL (ref 6.1–8.1)

## 2021-04-04 LAB — CBC WITH DIFFERENTIAL/PLATELET
Absolute Monocytes: 481 cells/uL (ref 200–950)
Basophils Absolute: 30 cells/uL (ref 0–200)
Basophils Relative: 0.4 %
Eosinophils Absolute: 252 cells/uL (ref 15–500)
Eosinophils Relative: 3.4 %
HCT: 46.8 % (ref 38.5–50.0)
Hemoglobin: 15.3 g/dL (ref 13.2–17.1)
Lymphs Abs: 2316 cells/uL (ref 850–3900)
MCH: 30.9 pg (ref 27.0–33.0)
MCHC: 32.7 g/dL (ref 32.0–36.0)
MCV: 94.5 fL (ref 80.0–100.0)
MPV: 12.8 fL — ABNORMAL HIGH (ref 7.5–12.5)
Monocytes Relative: 6.5 %
Neutro Abs: 4322 cells/uL (ref 1500–7800)
Neutrophils Relative %: 58.4 %
Platelets: 186 10*3/uL (ref 140–400)
RBC: 4.95 10*6/uL (ref 4.20–5.80)
RDW: 12.4 % (ref 11.0–15.0)
Total Lymphocyte: 31.3 %
WBC: 7.4 10*3/uL (ref 3.8–10.8)

## 2021-04-04 LAB — PSA: PSA: 0.16 ng/mL (ref <=4.0)

## 2021-04-10 ENCOUNTER — Ambulatory Visit (INDEPENDENT_AMBULATORY_CARE_PROVIDER_SITE_OTHER): Payer: Medicare Other | Admitting: Internal Medicine

## 2021-04-10 ENCOUNTER — Other Ambulatory Visit: Payer: Self-pay

## 2021-04-10 ENCOUNTER — Encounter: Payer: Self-pay | Admitting: Internal Medicine

## 2021-04-10 VITALS — BP 110/80 | HR 76 | Temp 98.5°F

## 2021-04-10 DIAGNOSIS — Z1211 Encounter for screening for malignant neoplasm of colon: Secondary | ICD-10-CM

## 2021-04-10 DIAGNOSIS — L219 Seborrheic dermatitis, unspecified: Secondary | ICD-10-CM

## 2021-04-10 DIAGNOSIS — G800 Spastic quadriplegic cerebral palsy: Secondary | ICD-10-CM | POA: Diagnosis not present

## 2021-04-10 DIAGNOSIS — Z0000 Encounter for general adult medical examination without abnormal findings: Secondary | ICD-10-CM | POA: Diagnosis not present

## 2021-04-10 DIAGNOSIS — Z87442 Personal history of urinary calculi: Secondary | ICD-10-CM | POA: Diagnosis not present

## 2021-04-10 DIAGNOSIS — M7918 Myalgia, other site: Secondary | ICD-10-CM

## 2021-04-10 DIAGNOSIS — Z8719 Personal history of other diseases of the digestive system: Secondary | ICD-10-CM

## 2021-04-10 DIAGNOSIS — E7800 Pure hypercholesterolemia, unspecified: Secondary | ICD-10-CM

## 2021-04-10 DIAGNOSIS — H9193 Unspecified hearing loss, bilateral: Secondary | ICD-10-CM | POA: Diagnosis not present

## 2021-04-10 NOTE — Patient Instructions (Signed)
It was a pleasure to see his mother and him today.  Return in 1 year or as needed.  His meds have been reviewed.  Forms and MAR sent from group home have been signed and will be taken back to group home by his mother.  Cologuard has been ordered.

## 2021-04-10 NOTE — Progress Notes (Signed)
Subjective:    Patient ID: Dennis Walsh., male    DOB: 1966-12-06, 55 y.o.   MRN: [id number]  HPI  55 year old Male with cerebral palsy with spastic quadriplegia seen for Medicare wellness , health maintenance exam, and evaluation of medical issues.  In March 2020 he was found to have an elevated serum creatinine of 2.01 and in the previous year had been 0.76.  Subsequently had CT of the abdomen and pelvis without contrast and was found to have bilateral hydronephrosis and hydroureter.  He had a 5 x 7 calculus in the distal left ureter about 3 cm proximal to the left UVJ.  He had a 4 mm calculus in the right UVJ.  He had a fecal impaction.  Was seen by urologist and was taken to surgery where he had cystoscopy and laser lithotripsy.  He continues to be followed by urology and continues to have kidney stones that are being observed.  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  He has a motorized wheelchair.  He is on Paxil for agitation.  He has stayed on this for a number of years and it seems to help his affect and agitation.  History of seborrhea of the face treated with Nizoral cream.  He has a history of constipation.  Tends to have a large bowel movement according to his mother who accompanies him today every 4 days or so.  He takes fiber and MiraLAX.  History of hearing loss and wears bilateral hearing aids.  He has a speech impediment.  Mother reports he has had COVID-19 immunizations but we do not have a copy of those dates.  No known drug allergies.  Adhesive capsulitis right shoulder 2009.  He has never lived independently.  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  He has to be fed because of significant spasticity and decreased range of motion of the right upper extremity.  He is right-handed.  Mother says this became more of an issue after his cervical neck surgery in 2008.    He  underwent C-spine decompression and fusion from C2-T1 for cervical spondylosis with myelopathy and April 2008 at [facility] due to spinal stenosis.  As I understand it, his father is his guardian and his mother has his power of attorney.  Social history: His parents are divorced.  He has no siblings.  He does not smoke, use alcohol or illicit drugs.  He enjoys watching TV, drawing on his computer and also [employer] basketball.  His affect is generally pleasant.  In September 2010 he had lengthening of biceps tendon with lengthening of brachioradialis's on the right.  He had excision of his distal clavicle on the right and denervation of his right upper extremity performed [facility].  He subsequently had tachycardia and hypoxia postoperatively and was thought to have aspirated.  Chest x-ray showed bibasilar opacities.  CTA was negative for pulmonary emboli.  He was treated with antibiotics and improved.  Spoke with his mother about screening for colon cancer.  We have ordered Cologuard.  I have explained to her that stool sample will need to be collected by personnel at group home and package will need to be mailed back to the company.  His tetanus immunization is up-to-date.  He had Prevnar 13 in 2015 and pneumococcal 23 in 2010.    Review of Systems hx of kidney stones and  has been seen at [facility]. Last visit in November, he had one on each side apprx 1 cm in size.     Objective:   Physical Exam He has seborrhea in the nasolabial folds for which Nizoral is used.  Vital signs are reviewed.  He is alert and cooperative is much as he can be.  He has significant spasticity of the upper extremities.  His TMs are clear.  No carotid bruits.  No thyromegaly.  Pharynx is clear.  Dentition is good.  Chest is clear to auscultation without rales or wheezing.  Cardiac exam: Regular rate and rhythm without murmurs or gallops.  Abdomen is slightly obese,  slightly distended without hepatosplenomegaly masses or tenderness.  No lower extremity pitting edema.  He has spastic quadriplegia.  He has a speech impediment.       Assessment & Plan:  Cerebral palsy with spastic quadriplegia and speech impediment  History of bilateral kidney stones which continue to be monitored by [facility]  Hearing loss  Seborrheic dermatitis of face  History of constipation  He has mild elevation of LDL at 130 and 2 years ago was 98.  Last year LDL was 131.  Remainder of lipid panel is normal.  CBC is within normal limits as is PSA and c-Met.  Plan: Continue current medications as previously prescribed and return in 1 year.  He is a candidate for Shingrix vaccine at local pharmacy.  We told his mother this today.  This would be a 2 dose series given 2 months apart.  Subjective:   Patient presents for Medicare Annual/Subsequent preventive examination.  Review Past Medical/Family/Social: See above.  Parents are supportive.   Risk Factors  Current exercise habits: Not able to exercise due to spastic quadriplegia Dietary issues discussed: Eats regular diet  Cardiac risk factors: Elevated LDL and family history  Depression Screen not depressed that we know of (Note: if answer to either of the following is "Yes", a more complete depression screening is indicated)   Over the past two weeks, have you felt down, depressed or hopeless? No  Over the past two weeks, have you felt little interest or pleasure in doing things? No Have you lost interest or pleasure in daily life? No Do you often feel hopeless? No Do you cry easily over simple problems? No   Activities of Daily Living  In your present state of health, do you have any difficulty performing the following activities?:   Driving?  Does not drive Managing money?  Does not manage money Feeding yourself?  Requires assistance Getting from bed to chair?  Requires assistance Climbing a flight of  stairs?  Generally does not climb stairs Preparing food and eating?:  Does not prepare his food Bathing or showering?  Yes requires assistance Getting dressed: Yes requires assist Getting to the toilet?  Yes requires assistance to have bowel movement.  Can use urinal in chair Using the toilet: See above Moving around from place to place: Spastic quadriplegia creating issues with ambulation In the past year have you fallen or had a near fall?:  Not that we know more Are you sexually active? No  Do you have more than one partner? No   Hearing Difficulties: Yes wears hearing aids Do you often ask people to speak up or repeat themselves?  Yes sometimes Do you experience ringing or noises in your ears?  Not asked Do you have difficulty understanding soft or whispered voices?  Sometimes Do you feel that you have  a problem with memory?  Not assessed Do you often misplace items? No    Home Safety:  Do you have a smoke alarm at your residence? Yes Do you have grab bars in the bathroom?  Lives in group home Do you have throw rugs in your house?  Not ask   Cognitive Testing not tested Alert? Yes Normal Appearance?Yes  Oriented to person? Yes Place? Yes  Time?  Not asked Recall of three objects?  Not asked Can perform simple calculations?  Not tested Displays appropriate judgment?  Not tested Can read the correct time from a watch face?  Not tested  List the Names of Other Physician/Practitioners you currently use:  See referral list for the physicians patient is currently seeing.  [facility]   Review of Systems: See above   Objective:     General appearance: Appears stated age and mildly obese Head: Normocephalic, without obvious abnormality, atraumatic  Eyes: conj clear, EOMi PEERLA  Ears: normal TM's and external ear canals both ears  Nose: Nares normal. Septum midline. Mucosa normal. No drainage or sinus tenderness.  Throat: lips, mucosa, and tongue normal; teeth and  gums normal  Neck: no adenopathy, no carotid bruit, no JVD, supple, symmetrical, trachea midline and thyroid not enlarged, symmetric, no tenderness/mass/nodules  No CVA tenderness.  Lungs: clear to auscultation bilaterally  Breasts: normal appearance Heart: regular rate and rhythm, S1, S2 normal, no murmur, click, rub or gallop  Abdomen: soft, non-tender; bowel sounds normal; no masses, no organomegaly  Musculoskeletal: ROM normal in all joints, no crepitus, no deformity, Normal muscle strengthen. Back  is symmetric, no curvature. Skin: Skin color, texture, turgor normal. No rashes or lesions.  He has seborrheic dermatitis on his face Lymph nodes: Cervical, supraclavicular, and axillary nodes normal.  Neurologic: Speech impediment and spastic quadriplegia due to cerebral palsy Psych: Alert- Mood appears stable.    Assessment:    Annual wellness medicare exam   Plan:    During the course of the visit the patient was educated and counseled about appropriate screening and preventive services including:   Recommend Shingrix vaccine at local pharmacy     Patient Instructions (the written plan) was given to the patient.  Medicare Attestation  I have personally reviewed:  The patient's medical and social history  Their use of alcohol, tobacco or illicit drugs  Their current medications and supplements  The patient's functional ability including ADLs,fall risks, home safety risks, cognitive, and hearing and visual impairment  Diet and physical activities  Evidence for depression or mood disorders  The patient's weight, height, BMI, and visual acuity have been recorded in the chart. I have made referrals, counseling, and provided education to the patient based on review of the above and I have provided the patient with a written personalized care plan for preventive services.

## 2021-07-13 ENCOUNTER — Telehealth: Payer: Medicare Other | Admitting: Internal Medicine

## 2021-07-13 NOTE — Telephone Encounter (Signed)
Dennis Walsh from Facility [phone] office [phone] her cell  Dennis Walsh called to say Garey has a place on his back that looks like a mole to her but it is hard, it is embedded and does not look like other place on his back, thought you or a dermatologist might should look at it.

## 2021-07-13 NOTE — Telephone Encounter (Signed)
Dennis Walsh called back to say she had talked with patients dad, he will look at it on Sunday, but was glad she had made an appointment. She will call first thing Monday morning if appointment needs to be canceled.

## 2021-07-13 NOTE — Telephone Encounter (Signed)
LVM for Brandy to [facility]

## 2021-07-13 NOTE — Telephone Encounter (Addendum)
When I called back to schedule appointment, Brandy said yes it could be stopped up blackhead. York Spaniel it was very hard.

## 2021-07-13 NOTE — Telephone Encounter (Signed)
Called and spoke with Jamesetta So, patients mom, she will go by tomorrow and look at place and call Trell patients dad, then someone will call us if they want Caide to be seen. Canceled appointment for now.

## 2021-07-16 ENCOUNTER — Ambulatory Visit: Payer: Medicare Other | Admitting: Internal Medicine

## 2021-08-08 DIAGNOSIS — Z0289 Encounter for other administrative examinations: Secondary | ICD-10-CM

## 2021-09-05 ENCOUNTER — Other Ambulatory Visit: Payer: Self-pay | Admitting: Internal Medicine

## 2021-09-28 ENCOUNTER — Telehealth: Payer: Self-pay | Admitting: Internal Medicine

## 2021-09-28 DIAGNOSIS — Z0289 Encounter for other administrative examinations: Secondary | ICD-10-CM

## 2021-09-28 NOTE — Telephone Encounter (Signed)
Completed DMV form.

## 2022-02-15 ENCOUNTER — Other Ambulatory Visit: Payer: Self-pay | Admitting: Internal Medicine

## 2022-02-25 ENCOUNTER — Telehealth: Payer: Self-pay | Admitting: Internal Medicine

## 2022-02-25 NOTE — Telephone Encounter (Signed)
Brandy From Group Home ?[phone] ? ?Brandy from group home called and would like to bring Dennis Walsh in she thinks he may possible have UTI, he mother agrees that he needs to be seen. He started screaming with back pain last week, had bowel movement on Friday, they thought that might be what it was, but he is still having pain today. They also had some adjustments done on his wheel chair and that has not helped either. ?

## 2022-02-25 NOTE — Telephone Encounter (Signed)
LMOM for Brandy to call back. This is the 4h time I have tried to call but the first time I was able to leave a message. We will need someone to get a urine specimen cup so we can test urine prior to [place] seeing him per Dr Renold Genta.  ?

## 2022-02-26 NOTE — Telephone Encounter (Signed)
Facility has sterile cups on site. They will obtain clean catch and bring with him to his appt on Thursday. He does have history of kidney stone they have been watching for a year now.  ?

## 2022-02-28 ENCOUNTER — Encounter: Payer: Self-pay | Admitting: Internal Medicine

## 2022-02-28 ENCOUNTER — Other Ambulatory Visit: Payer: Self-pay

## 2022-02-28 ENCOUNTER — Ambulatory Visit (INDEPENDENT_AMBULATORY_CARE_PROVIDER_SITE_OTHER): Payer: Medicare Other | Admitting: Internal Medicine

## 2022-02-28 VITALS — BP 118/78 | HR 85 | Temp 98.8°F

## 2022-02-28 DIAGNOSIS — R8290 Unspecified abnormal findings in urine: Secondary | ICD-10-CM | POA: Diagnosis not present

## 2022-02-28 DIAGNOSIS — M25552 Pain in left hip: Secondary | ICD-10-CM | POA: Diagnosis not present

## 2022-02-28 DIAGNOSIS — M549 Dorsalgia, unspecified: Secondary | ICD-10-CM | POA: Diagnosis not present

## 2022-02-28 LAB — POCT URINALYSIS DIPSTICK
Blood, UA: NEGATIVE
Glucose, UA: NEGATIVE
Ketones, UA: NEGATIVE
Leukocytes, UA: NEGATIVE
Nitrite, UA: NEGATIVE
Protein, UA: NEGATIVE
Spec Grav, UA: >=1.03 — AB (ref 1.010–1.025)
Urobilinogen, UA: 0.2 E.U./dL
pH, UA: 5 (ref 5.0–8.0)

## 2022-02-28 MED ORDER — CYCLOBENZAPRINE HCL 10 MG PO TABS: 10.0000 mg | ORAL_TABLET | Freq: Three times a day (TID) | ORAL | 0 refills | Status: AC | PRN

## 2022-02-28 NOTE — Progress Notes (Signed)
Done,

## 2022-02-28 NOTE — Progress Notes (Signed)
? ?Subjective:  ? ? Patient ID: Dennis Philibert., male    DOB: 1966/01/09, 56 y.o.   MRN: [id number] ? ?HPI 56 year old White Male who has history of cerebral palsy with spastic quadriplegia.  In March 2020, he was found to have elevated serum creatinine of 2.01 and in the previous year had been 0.76.  He had CT of the abdomen and pelvis without contrast and was found to have bilateral hydronephrosis and hydroureter.  He had a 5.7 mm calculus in the distal left ureter about 3 cm proximal to the left UVJ.  He had a 4 mm calculus at the right UVJ.  He had a fecal impaction.  He was taken to surgery by Urology where he underwent cystoscopy and laser lithotripsy. ? 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 ? ?He uses a motorized wheelchair. ? ?He takes Paxil for agitation and has been on this for a number of years. ? ?Takes MiraLAX for constipation. ? ?We received a call from [place] at patient's group home thinking that he might have a UTI.  His mother apparently was aware of the situation.  He apparently started screaming with back pain the week of March 6.  He subsequently had a bowel movement on Friday, March 10 but was still having pain on March 13.  We recommended that they obtain urine specimen and we recommended an appointment.  It took several phone calls to arrange for this appointment today. ? ?His urine is a dark color.  He is accompanied by caretaker today who indicates that his urine is typically a dark color.  Urine was sent for microscopic and was negative for white cells and red blood cells.  No bacteria.  No hyaline casts.  A CBC was obtained today and white blood cell count is normal at 6800.  Hemoglobin normal at 14.6 g.  His urine specific gravity was greater than 1.030 on urine dipstick. ? ?Glucose is normal at 69.  BUN and creatinine are normal.  Estimated GFR is 88 cc/min.  Electrolytes are normal.  Liver functions are normal.  Bilirubin is  normal. ? ?Caretaker indicates that they have determined he is tender over his right lower back area. ? ? ? ?Review of Systems no nausea, vomiting, change in appetite.  He is alert today and cooperative. ? ?   ?Objective:  ? Physical Exam ?He is afebrile.  Blood pressure 118/78 pulse 85 pulse oximetry 98% ? ?Skin: Warm and dry.  Chest is clear to auscultation.  Cardiac exam: Regular rate and rhythm without ectopy or murmurs. ? ?He has palpable tenderness over his right posterior superior iliac spine. ? ? ? ?   ?Assessment & Plan:  ?Cerebral palsy with spastic quadriplegia ? ?History of constipation treated with MiraLAX ? ?History of kidney stones ? ?His labs are stable.  He has no evidence of jaundice.  His urine was sent for culture. ? ?It is my feeling that he has right hip pain over right posterior superior iliac spine.  He has no history of trauma to our knowledge.  He will be sent to [facility] where I assume they have lifting equipment for him to be x-rayed.  Have ordered DG right hip and pelvis films.  We discussed pain medication and we are going to try Flexeril 10 mg up to 3 times daily as needed for pain. ? ?Time spent reviewing chart, interviewing caretaker and examining patient as well as medical decision making is 30  minutes. ? ?

## 2022-03-01 LAB — COMPLETE METABOLIC PANEL WITH GFR
AG Ratio: 1.7 (calc) (ref 1.0–2.5)
ALT: 13 U/L (ref 9–46)
AST: 18 U/L (ref 10–35)
Albumin: 4.3 g/dL (ref 3.6–5.1)
Alkaline phosphatase (APISO): 72 U/L (ref 35–144)
BUN: 24 mg/dL (ref 7–25)
CO2: 26 mmol/L (ref 20–32)
Calcium: 9.3 mg/dL (ref 8.6–10.3)
Chloride: 100 mmol/L (ref 98–110)
Creat: 1.01 mg/dL (ref 0.70–1.30)
Globulin: 2.5 g/dL (calc) (ref 1.9–3.7)
Glucose, Bld: 69 mg/dL (ref 65–99)
Potassium: 4.1 mmol/L (ref 3.5–5.3)
Sodium: 141 mmol/L (ref 135–146)
Total Bilirubin: 0.3 mg/dL (ref 0.2–1.2)
Total Protein: 6.8 g/dL (ref 6.1–8.1)
eGFR: 88 mL/min/{1.73_m2} (ref >=60)

## 2022-03-01 LAB — URINALYSIS, MICROSCOPIC ONLY
Bacteria, UA: NONE SEEN /HPF
Hyaline Cast: NONE SEEN /LPF
RBC / HPF: NONE SEEN /HPF (ref 0–2)
Squamous Epithelial / HPF: NONE SEEN /HPF (ref <=5)
WBC, UA: NONE SEEN /HPF (ref 0–5)

## 2022-03-01 LAB — CBC WITH DIFFERENTIAL/PLATELET
Absolute Monocytes: 551 cells/uL (ref 200–950)
Basophils Absolute: 48 cells/uL (ref 0–200)
Basophils Relative: 0.7 %
Eosinophils Absolute: 313 cells/uL (ref 15–500)
Eosinophils Relative: 4.6 %
HCT: 43.6 % (ref 38.5–50.0)
Hemoglobin: 14.6 g/dL (ref 13.2–17.1)
Lymphs Abs: 2237 cells/uL (ref 850–3900)
MCH: 31.5 pg (ref 27.0–33.0)
MCHC: 33.5 g/dL (ref 32.0–36.0)
MCV: 94.2 fL (ref 80.0–100.0)
MPV: 12.5 fL (ref 7.5–12.5)
Monocytes Relative: 8.1 %
Neutro Abs: 3652 cells/uL (ref 1500–7800)
Neutrophils Relative %: 53.7 %
Platelets: 233 10*3/uL (ref 140–400)
RBC: 4.63 10*6/uL (ref 4.20–5.80)
RDW: 13 % (ref 11.0–15.0)
Total Lymphocyte: 32.9 %
WBC: 6.8 10*3/uL (ref 3.8–10.8)

## 2022-03-01 LAB — URINE CULTURE
MICRO NUMBER:: 13140018
Result:: NO GROWTH
SPECIMEN QUALITY:: ADEQUATE

## 2022-03-01 NOTE — Patient Instructions (Addendum)
Have ordered right hip x-rays at [facility] which caretaker assures me will be done early next week.  He will try Flexeril 10 mg up to 3 times daily as needed for pain.  I think he may have right SI joint pain but need to rule out occult fracture.  Labs drawn and pending including renal functions and urine culture.  ?

## 2022-03-05 ENCOUNTER — Other Ambulatory Visit: Payer: Self-pay

## 2022-03-05 ENCOUNTER — Ambulatory Visit (HOSPITAL_COMMUNITY)
Admission: RE | Admit: 2022-03-05 | Discharge: 2022-03-05 | Disposition: A | Discharge status: Home / Self Care | Payer: Medicare Other | Source: Ambulatory Visit | Attending: Internal Medicine | Admitting: Internal Medicine

## 2022-03-05 DIAGNOSIS — M25552 Pain in left hip: Secondary | ICD-10-CM | POA: Diagnosis present

## 2022-03-26 ENCOUNTER — Telehealth: Payer: Self-pay | Admitting: Internal Medicine

## 2022-03-26 DIAGNOSIS — Z0289 Encounter for other administrative examinations: Secondary | ICD-10-CM

## 2022-03-26 NOTE — Telephone Encounter (Signed)
Filled out FL2 attached labs and meds ? ?

## 2022-03-26 NOTE — Telephone Encounter (Signed)
Brandi who is the group home manager for pt called and wanted to know if Dr Lenord Fellers can fill out FL2 form for pt before his appt on 04/12/22, She said its due on 04/14/22. She said that Dr Lenord Fellers did it last year and that she will fax it over. Callback is [phone] ?

## 2022-03-28 NOTE — Telephone Encounter (Signed)
Form was picked up

## 2022-04-08 ENCOUNTER — Other Ambulatory Visit: Payer: Medicare Other

## 2022-04-08 DIAGNOSIS — G808 Other cerebral palsy: Secondary | ICD-10-CM

## 2022-04-08 DIAGNOSIS — Z125 Encounter for screening for malignant neoplasm of prostate: Secondary | ICD-10-CM

## 2022-04-08 DIAGNOSIS — E7800 Pure hypercholesterolemia, unspecified: Secondary | ICD-10-CM

## 2022-04-09 ENCOUNTER — Other Ambulatory Visit: Payer: Medicare Other

## 2022-04-09 DIAGNOSIS — R351 Nocturia: Secondary | ICD-10-CM

## 2022-04-09 DIAGNOSIS — E7800 Pure hypercholesterolemia, unspecified: Secondary | ICD-10-CM

## 2022-04-09 DIAGNOSIS — G800 Spastic quadriplegic cerebral palsy: Secondary | ICD-10-CM

## 2022-04-09 DIAGNOSIS — Z125 Encounter for screening for malignant neoplasm of prostate: Secondary | ICD-10-CM

## 2022-04-09 LAB — COMPLETE METABOLIC PANEL WITH GFR
AG Ratio: 1.7 (calc) (ref 1.0–2.5)
ALT: 10 U/L (ref 9–46)
AST: 15 U/L (ref 10–35)
Albumin: 4.3 g/dL (ref 3.6–5.1)
Alkaline phosphatase (APISO): 80 U/L (ref 35–144)
BUN: 23 mg/dL (ref 7–25)
CO2: 29 mmol/L (ref 20–32)
Calcium: 9.5 mg/dL (ref 8.6–10.3)
Chloride: 103 mmol/L (ref 98–110)
Creat: 0.93 mg/dL (ref 0.70–1.30)
Globulin: 2.5 g/dL (calc) (ref 1.9–3.7)
Glucose, Bld: 92 mg/dL (ref 65–99)
Potassium: 5.1 mmol/L (ref 3.5–5.3)
Sodium: 143 mmol/L (ref 135–146)
Total Bilirubin: 0.5 mg/dL (ref 0.2–1.2)
Total Protein: 6.8 g/dL (ref 6.1–8.1)
eGFR: 97 mL/min/{1.73_m2} (ref >=60)

## 2022-04-09 LAB — CBC WITH DIFFERENTIAL/PLATELET
Absolute Monocytes: 422 cells/uL (ref 200–950)
Basophils Absolute: 27 cells/uL (ref 0–200)
Basophils Relative: 0.4 %
Eosinophils Absolute: 228 cells/uL (ref 15–500)
Eosinophils Relative: 3.4 %
HCT: 43.3 % (ref 38.5–50.0)
Hemoglobin: 14.7 g/dL (ref 13.2–17.1)
Lymphs Abs: 2425 cells/uL (ref 850–3900)
MCH: 32.1 pg (ref 27.0–33.0)
MCHC: 33.9 g/dL (ref 32.0–36.0)
MCV: 94.5 fL (ref 80.0–100.0)
MPV: 12.2 fL (ref 7.5–12.5)
Monocytes Relative: 6.3 %
Neutro Abs: 3598 cells/uL (ref 1500–7800)
Neutrophils Relative %: 53.7 %
Platelets: 243 10*3/uL (ref 140–400)
RBC: 4.58 10*6/uL (ref 4.20–5.80)
RDW: 12.5 % (ref 11.0–15.0)
Total Lymphocyte: 36.2 %
WBC: 6.7 10*3/uL (ref 3.8–10.8)

## 2022-04-09 LAB — PSA: PSA: 0.22 ng/mL (ref <=4.00)

## 2022-04-09 LAB — LIPID PANEL
Cholesterol: 194 mg/dL (ref <200)
HDL: 47 mg/dL (ref >=40)
LDL Cholesterol (Calc): 124 mg/dL (calc) — ABNORMAL HIGH
Non-HDL Cholesterol (Calc): 147 mg/dL (calc) — ABNORMAL HIGH (ref <130)
Total CHOL/HDL Ratio: 4.1 (calc) (ref <5.0)
Triglycerides: 121 mg/dL (ref <150)

## 2022-04-12 ENCOUNTER — Encounter: Payer: Self-pay | Admitting: Internal Medicine

## 2022-04-12 ENCOUNTER — Ambulatory Visit (INDEPENDENT_AMBULATORY_CARE_PROVIDER_SITE_OTHER): Payer: Medicare Other | Admitting: Internal Medicine

## 2022-04-12 VITALS — BP 128/84 | HR 74 | Temp 97.8°F | Ht 62.0 in | Wt 155.0 lb

## 2022-04-12 DIAGNOSIS — H9193 Unspecified hearing loss, bilateral: Secondary | ICD-10-CM

## 2022-04-12 DIAGNOSIS — Z0000 Encounter for general adult medical examination without abnormal findings: Secondary | ICD-10-CM

## 2022-04-12 DIAGNOSIS — G800 Spastic quadriplegic cerebral palsy: Secondary | ICD-10-CM

## 2022-04-12 DIAGNOSIS — L219 Seborrheic dermatitis, unspecified: Secondary | ICD-10-CM | POA: Diagnosis not present

## 2022-04-12 DIAGNOSIS — Z87442 Personal history of urinary calculi: Secondary | ICD-10-CM | POA: Diagnosis not present

## 2022-04-12 DIAGNOSIS — Z8719 Personal history of other diseases of the digestive system: Secondary | ICD-10-CM

## 2022-04-12 DIAGNOSIS — Z0289 Encounter for other administrative examinations: Secondary | ICD-10-CM

## 2022-04-12 DIAGNOSIS — M7918 Myalgia, other site: Secondary | ICD-10-CM

## 2022-04-12 DIAGNOSIS — E7800 Pure hypercholesterolemia, unspecified: Secondary | ICD-10-CM

## 2022-04-12 MED ORDER — MELOXICAM 15 MG PO TABS: 15.0000 mg | ORAL_TABLET | Freq: Every day | ORAL | 3 refills | Status: DC

## 2022-04-12 MED ORDER — ROSUVASTATIN CALCIUM 5 MG PO TABS: 5.0000 mg | ORAL_TABLET | Freq: Every day | ORAL | 3 refills | Status: DC

## 2022-04-12 NOTE — Patient Instructions (Addendum)
Recommend Shingrix vaccine. Starting Crestor 5 mg daily for hyperlipidemia. RTC in 6 months for lipid and liver panels and OV.  Refill meloxicam for musculoskeletal pain. ?

## 2022-04-12 NOTE — Progress Notes (Unsigned)
Subjective:    Patient ID: Dennis Walsh., male    DOB: [birth year], 56 y.o.   MRN: [id number]  HPI 56 year old Male with cerebral palsy and spastic quadriplegia seen for Medicare wellness and health maintenance exam as well as evaluation of medical issues.  He is accompanied by his mother and caretaker from group home where he resides.  He uses a motorized wheelchair.  He takes Paxil for agitation.  He has been on this for a number of years and it seems to help his affect and agitation.  History of seborrhea of the face treated with Nizoral cream.  History of constipation.  Tends to have a large bowel movement every 4 days or so.  He takes fiber and MiraLAX.  No known drug allergies.  Adhesive capsulitis of right shoulder in 2009.  History of hearing loss and wears bilateral hearing aids.  He has a speech impediment.  Mother reports that he has had COVID-19 immunizations.  We do not have copies of those immunizations.  His tetanus immunization is up-to-date and he has had pneumococcal 13 and pneumococcal 23 vaccines.  We have recommended Shingrix vaccine through pharmacy.  In March 2020 he was found to have an elevated serum creatinine of 2.01 and in the previous year creatinine had been 0.76.  He had CT of the abdomen and pelvis without contrast and was found to have bilateral hydronephrosis and hydroureter.  He had a 5 x 7 calculus in the distal left ureter about 3 cm proximal to the left UVJ.  He had a 4 mm calculus in the right UVJ.  He had a fecal impaction.  He was seen by urologist and was taken to surgery for he had cystoscopy and laser lithotripsy.  He continues to have kidney stones that are being observed by Urology.  In March 2023,he was seen regarding back pain.  Urine specimen obtained at the group home showed no white cells or red cells.  CBC obtained and white blood cell count was normal at 6800.  Urine specific gravity was greater than 1.030 on urine dipstick.  BUN  and creatinine were normal.  Caretaker remarked that group home staff had determined he was tender over his right lower back.  He had left hip x-ray showing no acute bony abnormality but colonic constipation and rectal impaction.  He was treated with Flexeril 10 mg up to 3 times daily as needed for pain and improved.  He has never lived independently.  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  He has to be fed because of significant spasticity and decreased range of motion of the right upper extremity.  He is right-handed.  Mother says this became more of an issue after his cervical neck surgery in 2008.  He underwent C-spine decompression and fusion from C2-T1 for cervical spondylosis with myelopathy in April 2008 at [facility] due to spinal stenosis.  As I understand it, his father is his guardian and his mother has his power of attorney.  They are divorced.  Social history: His parents are divorced.  He has no siblings.  He does not smoke, use alcohol or illicit drugs.  He enjoys watching TV, drawing on his computer and enjoys watching [employer] basketball.  His affect is generally pleasant.  In September 2010, he had lengthening of biceps tendon with lengthening of brachioradialis on the right.  He had excision of his distal clavicle on  the right and denervation of his right upper extremity [facility].  He subsequently had tachycardia and hypoxia postoperatively and was thought to have aspirated.  Chest x-ray showed bibasilar opacities.  CTA was negative for pulmonary emboli.  He was treated with antibiotics and improved.  Cologuard was ordered in 2022 but was not done.  This needs to be reordered.          Review of Systems currently not complaining of any back pain.     Objective:   Physical Exam Vital signs reviewed.  Blood pressure stable.  Pulse oximetry 99%.  Weight 155 pounds. He  has seborrhea around his nose.  Nodes none.  TMs clear.  Neck supple.  No carotid bruits.  Chest clear to auscultation.  Cardiac exam: Regular rate and rhythm normal S1 and S2 without ectopy.  Abdomen is slightly distended.  No hepatosplenomegaly appreciated.  No tenderness.  He has spastic quadriplegia.  He has speech impairment.      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  History of bilateral kidney stones which continues to be monitored by urology  Bilateral hearing loss  Seborrheic dermatitis of face  History of constipation  Musculoskeletal pain recently treated with Flexeril and improved  Elevated LDL at 130.  We are starting him on Crestor 5 mg daily.  Suggest fasting lipid panel and liver functions in 6 months.  Plan: Have reviewed with mother and caretaker recommendations for immunizations.  He will continue with current medications and return in 1 year or as needed.  He is on chronic NSAID therapy due to musculoskeletal pain from being wheelchair-bound.  This is Mobic 15 mg daily and will be refilled.  Subjective:   Patient presents for Medicare Annual/Subsequent preventive examination.  Review Past Medical/Family/Social: Resides in group home.  Parents are divorced but are supportive.  Family history: Father with history of GE reflux, hypertension, anxiety, lumbar spinal stenosis.  Mother with hypertension.  Maternal grandfather with history of heart disease, diabetes and Parkinson's disease.   Risk Factors  Current exercise habits: Unable to exercise due to spastic quadriplegia Dietary issues discussed: Discussed with staff at group home  Cardiac risk factors: Sedentary, mild hyperlipidemia  Depression Screen  (Note: if answer to either of the following is "Yes", a more complete depression screening is indicated)  This is difficult to assess due to speech impediment.  He is on Paxil for agitation.    Activities of  Daily Living  In your present state of health, do you have any difficulty performing the following activities?:   Driving?  Does not drive Managing money?  Does not manage his own money Feeding yourself?  Some Getting from bed to chair?  Yes Climbing a flight of stairs?  Cannot climb stairs Preparing food and eating?:  Meals are prepared in group home Bathing or showering?  Needs help Getting dressed: Needs help Getting to the toilet?  Needs assistance Using the toilet needs assistance Moving around from place to place: Wheelchair confined In the past year have you fallen or had a near fall?:No  Are you sexually active? No    Hearing Difficulties: Yes wears hearing aids     Home Safety:  Do you have a smoke alarm at your residence? Yes Do you have grab bars in the bathroom?  Yes Do you have throw rugs in your house?  No   Cognitive Testing  Alert? Yes Normal Appearance Oriented to  person? Yes  Place? Yes  Time?  Not asked Recall of three objects?  Not tested Can perform simple calculations?  Not tested    List the Names of Other Physician/Practitioners you currently use:  See referral list for the physicians patient is currently seeing.     Review of Systems: See above has history of constipation, kidney stones and musculoskeletal pain   Objective:   He is wheelchair-bound and difficult to examine due to speech impediment and spastic quadriplegia  General appearance: Appears stated age and mildly obese  Head: Normocephalic, without obvious abnormality, atraumatic  Eyes: conj clear, EOMi PEERLA  Ears: normal TM's and external ear canals both ears  Nose: Nares normal. Septum midline. Mucosa normal. No drainage or sinus tenderness.  Throat: lips, mucosa, and tongue normal; teeth and gums normal  Neck: no adenopathy, no carotid bruit, no JVD, supple, symmetrical, trachea midline and thyroid not enlarged, symmetric, no tenderness/mass/nodules  No CVA tenderness.   Lungs: clear to auscultation bilaterally  Breasts: normal appearance, no masses  Heart: regular rate and rhythm, S1, S2 normal, no murmur, click, rub or gallop  Abdomen: Slight distention due to constipation no hepatosplenomegaly appreciated Musculoskeletal: ROM normal in all joints, no crepitus, no deformity, Normal muscle strengthen. Back  is symmetric, no curvature. Skin: Skin color, texture, turgor normal. No rashes or lesions  Lymph nodes: Cervical, supraclavicular, and axillary nodes normal.  Neurologic: Spastic quadriplegia and speech impediment. Psych: Alert & Oriented x 3, Mood appear stable.    Assessment:    Annual wellness medicare exam   Plan:    During the course of the visit the patient was educated and counseled about appropriate screening and preventive services including:   Vaccines discussed with mother     Patient Instructions (the written plan) was given to the patient.  Medicare Attestation  I have personally reviewed:  The patient's medical and social history  Their use of alcohol, tobacco or illicit drugs  Their current medications and supplements  The patient's functional ability including ADLs,fall risks, home safety risks, cognitive, and hearing and visual impairment  Diet and physical activities  Evidence for depression or mood disorders  The patient's weight, height, BMI, and visual acuity have been recorded in the chart. I have made referrals, counseling, and provided education to the patient based on review of the above and I have provided the patient with a written personalized care plan for preventive services.

## 2022-04-16 ENCOUNTER — Telehealth: Payer: Self-pay

## 2022-04-16 NOTE — Telephone Encounter (Signed)
Spoke to [facility] and have D/C'd the naproxen.  ?

## 2022-05-14 ENCOUNTER — Other Ambulatory Visit: Payer: Self-pay

## 2022-05-14 DIAGNOSIS — Z1211 Encounter for screening for malignant neoplasm of colon: Secondary | ICD-10-CM

## 2022-05-14 DIAGNOSIS — Z1213 Encounter for screening for malignant neoplasm of small intestine: Secondary | ICD-10-CM

## 2022-06-03 IMAGING — DX DG HIP (WITH OR WITHOUT PELVIS) 2-3V*L*
3 series · 3 of 3 positions shown · non-contrast
Comparison: None.

CLINICAL DATA: Left hip pain, no known injury, initial encounter

EXAM:
DG HIP (WITH OR WITHOUT PELVIS) 3V LEFT

[pelvis ap]
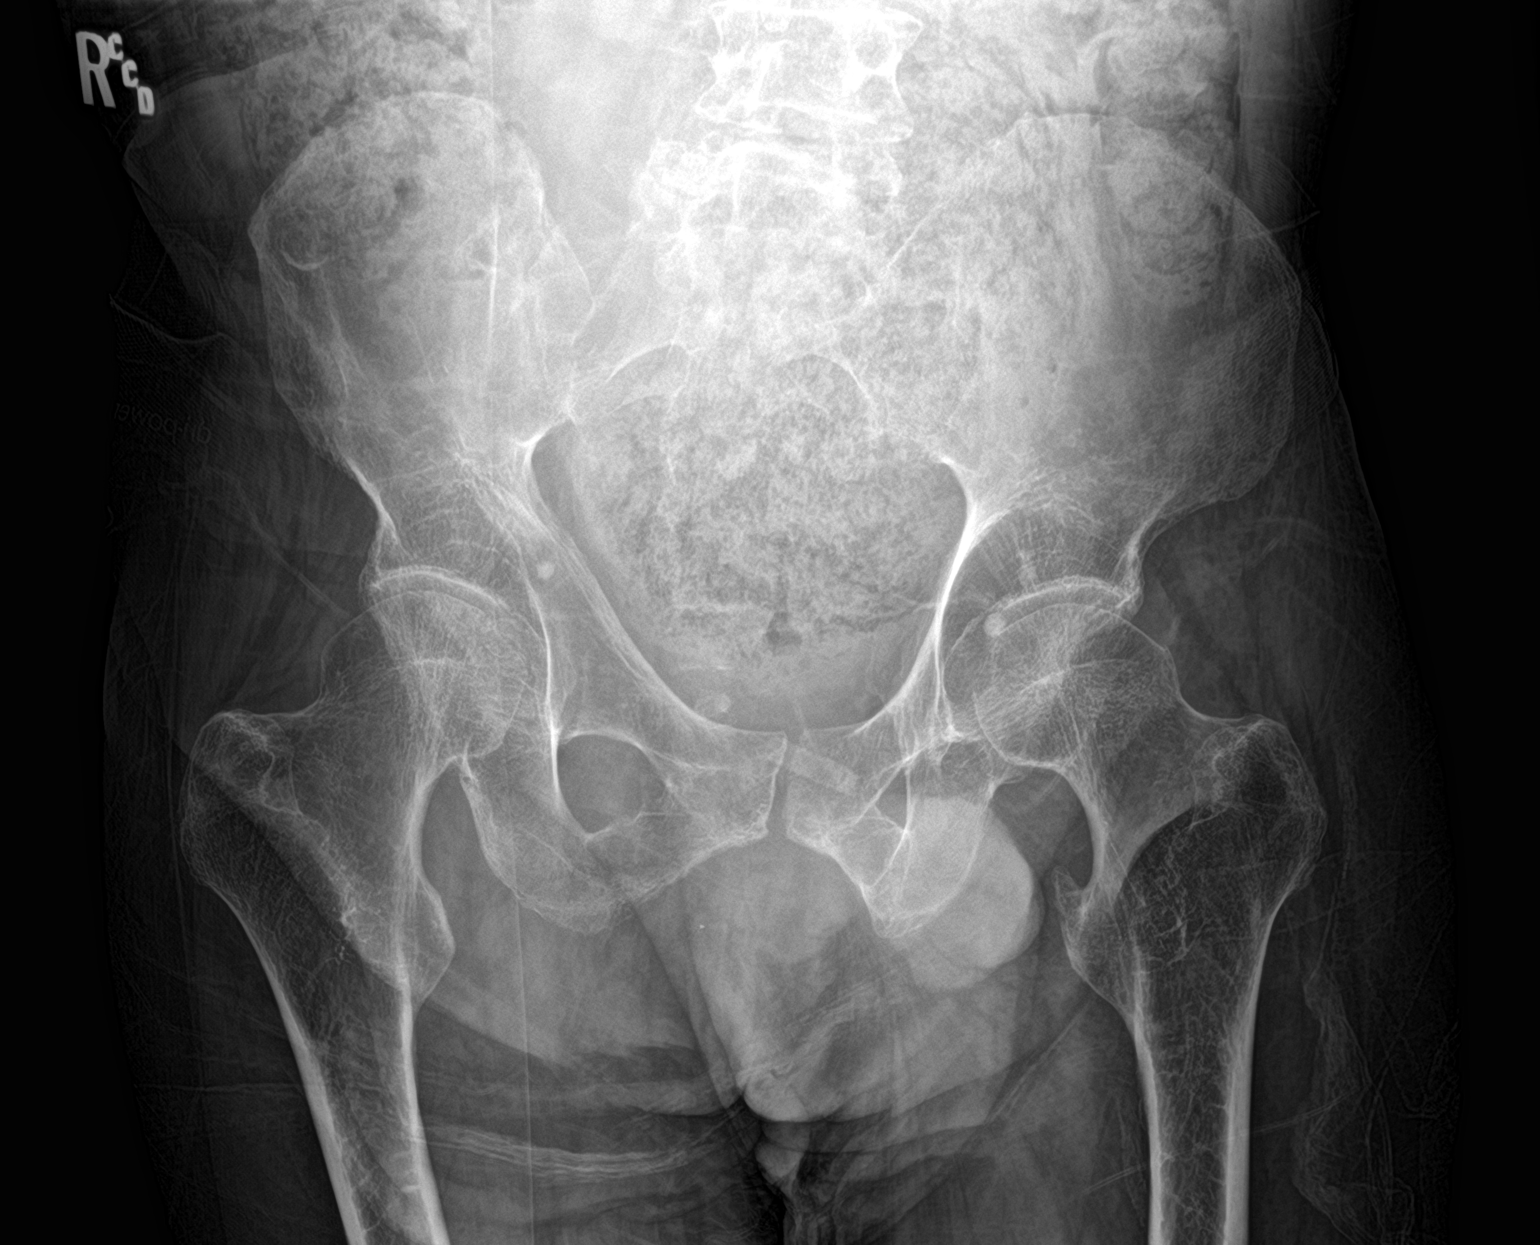

[hip ap]
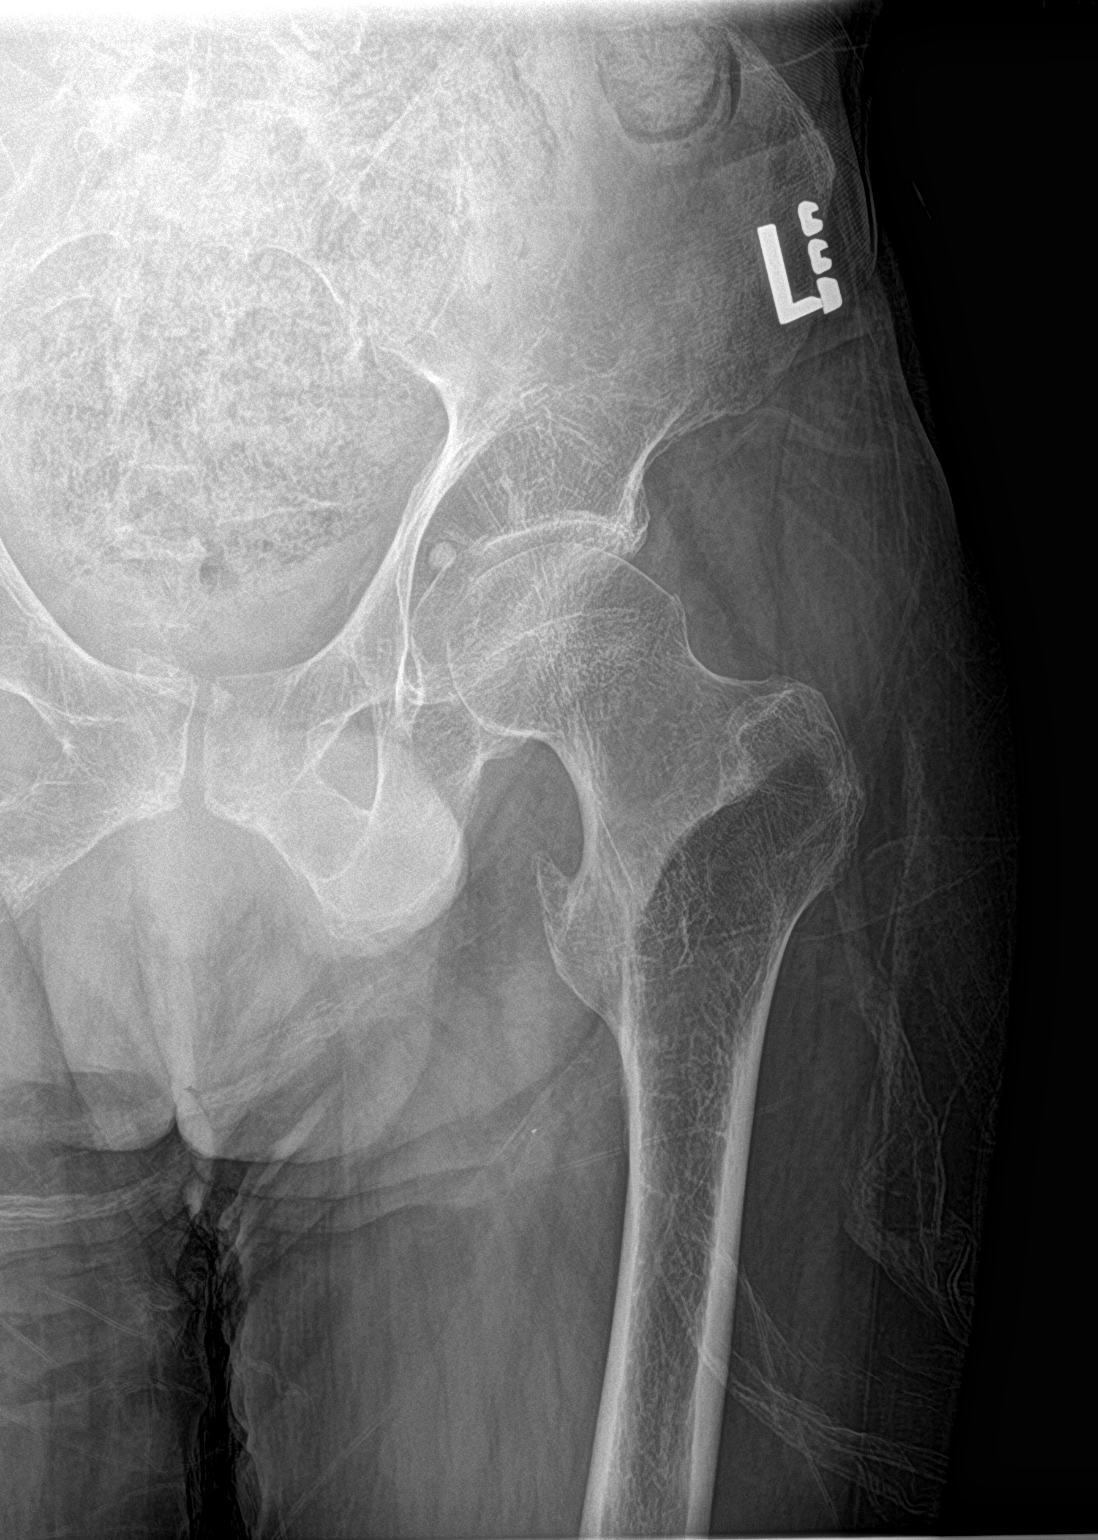

[hip lat]
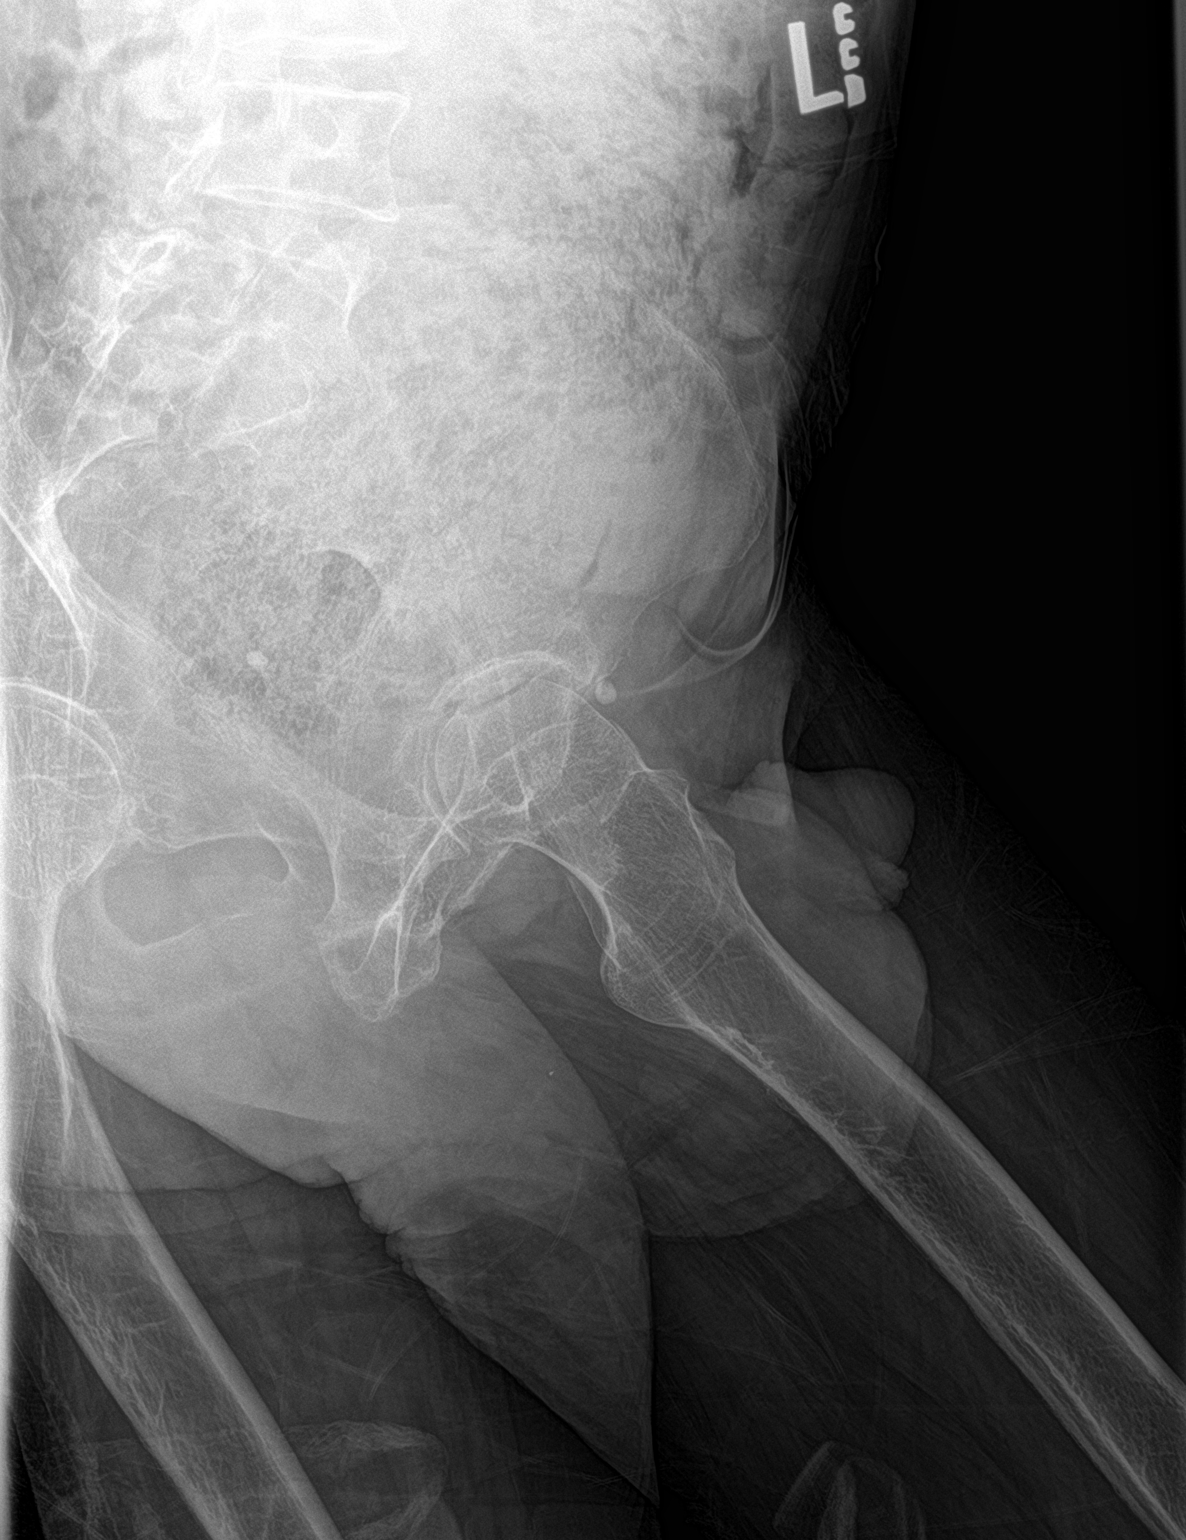

[3 of 3 positions shown; findings below may reference images not displayed]

FINDINGS: Pelvic ring is intact. Degenerative changes of lower lumbar spine
are seen. No acute fracture or dislocation is seen. No soft tissue
abnormality is noted. Changes consistent with colonic constipation
and mild rectal impaction are noted.
IMPRESSION: No acute bony abnormality is seen.

Colonic constipation and rectal impaction.

## 2022-09-25 ENCOUNTER — Telehealth: Payer: Self-pay | Admitting: Internal Medicine

## 2022-09-25 NOTE — Telephone Encounter (Signed)
Brandy from [facility] called. Patient is at pharmacy trying to get pneumonia vaccine but they don't know which one to give him. He has had pneumococcal 13 in December 2015 and pneumococcal 23 in July 2010 and March 2020.  He only need Pneumococcal 20 and after this one likely does not need any more pneumonia vaccines

## 2022-10-03 ENCOUNTER — Other Ambulatory Visit: Payer: Medicare Other

## 2022-10-03 DIAGNOSIS — E7800 Pure hypercholesterolemia, unspecified: Secondary | ICD-10-CM

## 2022-10-04 ENCOUNTER — Other Ambulatory Visit: Payer: Medicare Other

## 2022-10-04 DIAGNOSIS — Z1213 Encounter for screening for malignant neoplasm of small intestine: Secondary | ICD-10-CM

## 2022-10-04 DIAGNOSIS — E7800 Pure hypercholesterolemia, unspecified: Secondary | ICD-10-CM

## 2022-10-04 DIAGNOSIS — Z1211 Encounter for screening for malignant neoplasm of colon: Secondary | ICD-10-CM

## 2022-10-05 LAB — HEPATIC FUNCTION PANEL
AG Ratio: 2 (calc) (ref 1.0–2.5)
ALT: 14 U/L (ref 9–46)
AST: 17 U/L (ref 10–35)
Albumin: 4.1 g/dL (ref 3.6–5.1)
Alkaline phosphatase (APISO): 86 U/L (ref 35–144)
Bilirubin, Direct: 0.1 mg/dL (ref 0.0–0.2)
Globulin: 2.1 g/dL (calc) (ref 1.9–3.7)
Indirect Bilirubin: 0.2 mg/dL (calc) (ref 0.2–1.2)
Total Bilirubin: 0.3 mg/dL (ref 0.2–1.2)
Total Protein: 6.2 g/dL (ref 6.1–8.1)

## 2022-10-05 LAB — LIPID PANEL
Cholesterol: 130 mg/dL (ref <200)
HDL: 51 mg/dL (ref >=40)
LDL Cholesterol (Calc): 60 mg/dL (calc)
Non-HDL Cholesterol (Calc): 79 mg/dL (calc) (ref <130)
Total CHOL/HDL Ratio: 2.5 (calc) (ref <5.0)
Triglycerides: 103 mg/dL (ref <150)

## 2022-10-08 ENCOUNTER — Ambulatory Visit (INDEPENDENT_AMBULATORY_CARE_PROVIDER_SITE_OTHER): Payer: Medicare Other | Admitting: Internal Medicine

## 2022-10-08 ENCOUNTER — Encounter: Payer: Self-pay | Admitting: Internal Medicine

## 2022-10-08 VITALS — BP 124/80 | HR 95 | Temp 97.9°F | Ht 62.0 in | Wt 150.0 lb

## 2022-10-08 DIAGNOSIS — H9193 Unspecified hearing loss, bilateral: Secondary | ICD-10-CM | POA: Diagnosis not present

## 2022-10-08 DIAGNOSIS — Z8719 Personal history of other diseases of the digestive system: Secondary | ICD-10-CM | POA: Diagnosis not present

## 2022-10-08 DIAGNOSIS — L219 Seborrheic dermatitis, unspecified: Secondary | ICD-10-CM | POA: Diagnosis not present

## 2022-10-08 DIAGNOSIS — E7800 Pure hypercholesterolemia, unspecified: Secondary | ICD-10-CM | POA: Diagnosis not present

## 2022-10-08 DIAGNOSIS — G800 Spastic quadriplegic cerebral palsy: Secondary | ICD-10-CM

## 2022-10-08 NOTE — Patient Instructions (Signed)
We are checking into vaccines at [facility]. Lipids are stable. RTC in 6 months for Medicare wellness and health maintenance exam. Please have parents or staff call and schedule this with Yvonna Alanis, our front office person.

## 2022-10-08 NOTE — Progress Notes (Signed)
   Subjective:    Patient ID: Dennis Bosch., male    DOB: 07-09-1966, 56 y.o.   MRN: [id number]  HPI  56 year old Male seen for follow up. May have had some vaccines at [facility] and are checking into this. Eats wells. No new complaints. Denies pain. Has BM about once a week according to group home manager.  He is now on rosuvastatin 5 mg daily since April.  His lipids have completely normalized and liver functions are normal on this low-dose.  Previously had elevated LDL 6 months ago at 124.  He has had some vaccines recently at [facility] [place] as well as Pharmacy and we are checking into those.  He uses a motorized wheelchair.  He takes Paxil for agitation and has been on this for a number of years.  Seems to help affect and agitation.  Review of Systems see above     Objective:   Physical Exam  BP 124/80 pulse 95 T 97.9 degrees weight 150 lbs. BMI 27.44  Chest is clear.  Cardiac exam: Regular rate and rhythm.  No lower extremity pitting edema.  He has spastic quadriplegia and speech impediment.      Assessment & Plan:   Pure hypercholesterolemia-responding to low-dose rosuvastatin 5 mg daily.  Liver functions are normal.  Continue with this.  We need to check into dates of [facility].  It seems that he got 1 vaccine each at each location.  Caretaker does not have this info today.  Plan: Continue with current medications and follow-up here in 6 months for Medicare wellness and health maintenance exam.

## 2022-11-28 ENCOUNTER — Telehealth: Payer: Self-pay | Admitting: Internal Medicine

## 2022-11-28 ENCOUNTER — Telehealth (INDEPENDENT_AMBULATORY_CARE_PROVIDER_SITE_OTHER): Payer: Medicare Other | Admitting: Internal Medicine

## 2022-11-28 VITALS — HR 95 | Temp 98.2°F

## 2022-11-28 DIAGNOSIS — Z993 Dependence on wheelchair: Secondary | ICD-10-CM

## 2022-11-28 DIAGNOSIS — Z87442 Personal history of urinary calculi: Secondary | ICD-10-CM | POA: Diagnosis not present

## 2022-11-28 DIAGNOSIS — H9193 Unspecified hearing loss, bilateral: Secondary | ICD-10-CM

## 2022-11-28 DIAGNOSIS — G800 Spastic quadriplegic cerebral palsy: Secondary | ICD-10-CM | POA: Diagnosis not present

## 2022-11-28 DIAGNOSIS — U071 COVID-19: Secondary | ICD-10-CM | POA: Diagnosis not present

## 2022-11-28 MED ORDER — NIRMATRELVIR/RITONAVIR (PAXLOVID)TABLET: 3.0000 | ORAL_TABLET | Freq: Two times a day (BID) | ORAL | 0 refills | Status: AC

## 2022-11-28 NOTE — Telephone Encounter (Signed)
Nyan Dufresne Sr called to say that Dennis Walsh has tested positive for COVID this morning, he started with a cough on Tuesday, he now has a sore throat and cough. Brandy with the home is also going to be calling. Ericson would like for him to have something for COVID. He did not know if he had any other symptoms, I will get more information from [place] when she calls.

## 2022-11-28 NOTE — Telephone Encounter (Signed)
scheduled

## 2022-11-28 NOTE — Progress Notes (Signed)
   Subjective:    Patient ID: Dennis Walsh., male    DOB: 1966-03-06, 56 y.o.   MRN: [id number]  HPI 56 year old Male with history of cerebral palsy with spastic quadriplegia and speech impediment who lives in group home seen today via [employer].  He is acutely ill.  His caretaker there at the group home,Brandy, where he resides is present with him.  He is at his group home where he resides and I am at my office.  He is agreeable to speaking with me today.  He has had temperature last evening up to 99.8 degrees.  He was given Tylenol.  He has malaise and fatigue.  It has just been learned that he tested positive for COVID-19 this morning.  Apparently he started coughing on Tuesday, December 12 but did not test positive until today.  Apparently individuals in the group home also have COVID-19.  I records indicate he has been vaccinated in the past but perhaps not recently for COVID-19.  He did receive a flu vaccine October 11.  He also has received pneumococcal 20 vaccine.  He has a remote history of kidney stones.  He has bilateral hearing loss and wears bilateral hearing aids.  He has a speech impediment.  He is wheelchair dependent.  Review of Systems see above-no vomiting or diarrhea     Objective:   Physical Exam  He has a speech impediment and it is difficult to understand him.  However he looks fatigued.  He does not appear to be tachypneic.  Currently he is not coughing.  He appears to have malaise.  He looks a bit pale.      Assessment & Plan:  Acute COVID-19 virus infection  Plan: He will quarantine at the group home away from other residents.  Father is agreeable for him to take Paxlovid and this will be prescribed.  Group home manager will keep a close eye on him regarding hydration status and let me know if situation is deteriorating.  He needs to quarantine for 5 days.  Kidney functions are normal and he will take regular strength  Paxlovid.

## 2022-11-28 NOTE — Telephone Encounter (Signed)
Talked with brandy at the home and Dennis Walsh was running fever last night 99.8, she gave him tylenol he just does not feel well. I have set up video visit with her at 12:00 using her phone [phone]

## 2022-12-14 ENCOUNTER — Encounter: Payer: Self-pay | Admitting: Internal Medicine

## 2022-12-14 NOTE — Patient Instructions (Addendum)
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  We have spoken with his father by phone.  Patient and father agreeable for him to take Paxlovid.  His kidney functions are normal and he will be prescribed regular strength Paxlovid.   Group home manager will keep a close eye on him regarding hydration status.  She will let me know if this seems to be deteriorating.  He needs to quarantine for 5 days.

## 2023-01-22 ENCOUNTER — Telehealth: Payer: Self-pay | Admitting: Internal Medicine

## 2023-01-22 DIAGNOSIS — Z0289 Encounter for other administrative examinations: Secondary | ICD-10-CM

## 2023-01-22 NOTE — Telephone Encounter (Signed)
This message was sent via [place], a product from [employer] http://www.[employer].com/                    -------Fax Transmission Report-------  To:               Recipient at [phone] Subject:          FW: Hp Scans Result:           The transmission was successful. Explanation:      All Pages Ok Pages Sent:       3 Connect Time:     1 minutes, 31 seconds Transmit Time:    01/22/2023 12:12 Transfer Rate:    14400 Status Code:      0000 Retry Count:      0 Job Id:           [id number] Unique Id:        [id number] Fax Line:         19 Fax Server:       [employer]

## 2023-01-22 NOTE — Telephone Encounter (Signed)
Received form to complete and sign off on for wheel chair repair. Signed and faxed back to [employer] [phone], phone [phone].

## 2023-02-12 ENCOUNTER — Other Ambulatory Visit: Payer: Self-pay | Admitting: Internal Medicine

## 2023-02-27 ENCOUNTER — Telehealth: Payer: Self-pay | Admitting: Internal Medicine

## 2023-02-27 DIAGNOSIS — Z0289 Encounter for other administrative examinations: Secondary | ICD-10-CM

## 2023-02-27 NOTE — Telephone Encounter (Signed)
  To:               Recipient at [phone] Subject:          Fw: Hp Scans Result:           The transmission was successful. Explanation:      All Pages Ok Pages Sent:       6 Connect Time:     4 minutes, 5 seconds Transmit Time:    02/27/2023 13:13 Transfer Rate:    14400 Status Code:      0000 Retry Count:      0 Job Id:           [id number] Unique Id:        [id number] Fax Line:         12 Fax Server:       [employer]

## 2023-02-27 NOTE — Telephone Encounter (Addendum)
Dennis Walsh [phone] Group Home [phone] Fax [phone]  Brandy faxed FL2 form to be signed and faxed back.

## 2023-02-27 NOTE — Telephone Encounter (Signed)
Faxed signed FL2 back to group home

## 2023-03-10 LAB — COLOGUARD: COLOGUARD: NEGATIVE

## 2023-03-12 NOTE — Progress Notes (Signed)
Called and let his mom know Cologard results was negative.

## 2023-04-15 ENCOUNTER — Other Ambulatory Visit: Payer: Self-pay | Admitting: Internal Medicine

## 2023-04-15 DIAGNOSIS — E611 Iron deficiency: Secondary | ICD-10-CM

## 2023-06-16 NOTE — Progress Notes (Signed)
Annual Wellness Visit    Patient Care Team: Dennis Mackintosh, MD as PCP - General (Internal Medicine)  Visit Date: 06/23/23   Chief Complaint  Patient presents with   Medicare Wellness         Annual exam  Subjective:   Patient: Dennis Bueltel., Male    DOB: 10/21/1966, 57 y.o.   MRN: [id number]  Dennis Kaczka. is a 57 y.o. Male with cerebral palsy, dysarthria, and spastic quadriplegia seen for Medicare wellness and health maintenance exam as well as evaluation of medical issues.  He is accompanied by his father and caretaker/manger from Group Home where he resides here in [place]  Reports feeling well today with no new symptoms.  He uses a motorized wheelchair.  He takes Paxil for agitation.  He has been on this for a number of years and it seems to help his affect and agitation.  History of dependent edema treated with furosemide 40 mg daily.   History of hyperlipidemia treated with rosuvastatin 5 mg daily. Lipid panel normal.  History of musculoskeletal pain treated with meloxicam 15 mg daily.  History of GERD treated with omeprazole 20 mg daily.  History of seborrhea of the face treated with Nizoral cream.  History of constipation.  He is having one BM per day.  He takes fiber and MiraLAX.  No known drug allergies.  Adhesive capsulitis of right shoulder in 2009.  History of hearing loss and wears bilateral hearing aids.  He has a speech impediment.  Mother reports that he has had COVID-19 immunizations.  We do not have copies of those immunizations.  His tetanus immunization is up-to-date and he has had pneumococcal 13 and pneumococcal 23 vaccines.  We have recommended Shingrix vaccine through pharmacy.  In March 2020 he was found to have an elevated serum creatinine of 2.01 and in the previous year creatinine had been 0.76.  He had CT of the abdomen and pelvis without contrast and was found to have bilateral hydronephrosis and hydroureter.  He had  a 5 x 7 calculus in the distal left ureter about 3 cm proximal to the left UVJ.  He had a 4 mm calculus in the right UVJ.  He had a fecal impaction.  He was seen by urologist and was taken to surgery for he had cystoscopy and laser lithotripsy.  He continues to have kidney stones that are being observed by Urology.  In March 2023,he was seen regarding back pain.  Urine specimen obtained at the group home showed no white cells or red cells.  CBC obtained and white blood cell count was normal at 6800.  Urine specific gravity was greater than 1.030 on urine dipstick.  BUN and creatinine were normal.  Caretaker remarked that group home staff had determined he was tender over his right lower back.  He had left hip x-ray showing no acute bony abnormality but colonic constipation and rectal impaction.  He was treated with Flexeril 10 mg up to 3 times daily as needed for pain and improved.  He has never lived independently.  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  He has to be fed because of significant spasticity and decreased range of motion of the right upper extremity.  He is right-handed.  Mother says this became more of an issue after his cervical neck surgery in 2008.  He underwent C-spine decompression and fusion from C2-T1 for cervical spondylosis with  myelopathy in April 2008 at [facility] due to spinal stenosis.  As I understand it, his father is his guardian and his mother has his power of attorney.  They are divorced.  Negative Cologuard 03/03/23.  Glucose normal. Creatinine at 0.68. Liver functions normal. Electrolytes normal. Blood proteins normal. CBC normal. PSA at 0.22.  Social history: His parents are divorced.  He has no siblings.  He does not smoke, use alcohol or illicit drugs.  He enjoys watching TV, drawing on his computer and enjoys watching [employer] basketball.  His affect is generally pleasant.  In September 2010, he had  lengthening of biceps tendon with lengthening of brachioradialis on the right.  He had excision of his distal clavicle on the right and denervation of his right upper extremity performed at [facility].  He subsequently had tachycardia and hypoxia postoperatively and was thought to have aspirated.  Chest x-ray showed bibasilar opacities.  CTA was negative for pulmonary emboli.  He was treated with antibiotics and improved.   Past Medical History:  Diagnosis Date   Arthritis    bil knees   Cerebral palsy, quadriplegic (HCC)    Hearing loss    au   Mild mental retardation    Seborrhea    facial     History reviewed. No pertinent family history.   Social history: Single.  Resides in group home.  Family is supportive.  Parents are divorced.  He seems happy at group home.  Likes to watch sports on TV.  Does not smoke or consume alcohol.  Cologuard was ordered in 2022 but sample was not obtained apparently.  We can reorder this.  Review of Systems  Constitutional:  Negative for chills, fever, malaise/fatigue and weight loss.  HENT:  Negative for hearing loss, sinus pain and sore throat.   Respiratory:  Negative for cough, hemoptysis and shortness of breath.   Cardiovascular:  Negative for chest pain, palpitations, leg swelling and PND.  Gastrointestinal:  Negative for abdominal pain, constipation, diarrhea, heartburn, nausea and vomiting.  Genitourinary:  Negative for dysuria, frequency and urgency.  Musculoskeletal:  Negative for back pain, myalgias and neck pain.  Skin:  Negative for itching and rash.  Neurological:  Negative for dizziness, tingling, seizures and headaches.  Endo/Heme/Allergies:  Negative for polydipsia.  Psychiatric/Behavioral:  Negative for depression. The patient is not nervous/anxious.       Objective:   Vitals: BP 128/70   Pulse 74   Resp 16   Ht 5\' 8"  (1.727 m) Comment: family reported  Wt 150 lb (68 kg) Comment: aprox-family reported   SpO2 98%   BMI 22.81 kg/m      Most recent functional status assessment:    06/23/2023    3:02 PM  In your present state of health, do you have any difficulty performing the following activities:  Hearing? 0  Vision? 0  Difficulty concentrating or making decisions? 1  Walking or climbing stairs? 1  Dressing or bathing? 1  Doing errands, shopping? 1   Most recent fall risk assessment:    06/23/2023    3:02 PM  Fall Risk   Falls in the past year? 0  Number falls in past yr: 0  Injury with Fall? 0  Risk for fall due to : Impaired mobility;Mental status change    Most recent depression screenings:    11/28/2022   12:06 PM 10/08/2022   10:36 AM  PHQ 2/9 Scores  PHQ - 2 Score  0 0   Most recent cognitive screening:     No data to display           Results:   Studies obtained and personally reviewed by me:    Labs:       Component Value Date/Time   NA 141 06/17/2023 0926   K 4.5 06/17/2023 0926   CL 102 06/17/2023 0926   CO2 28 06/17/2023 0926   GLUCOSE 90 06/17/2023 0926   BUN 25 06/17/2023 0926   CREATININE 0.68 (L) 06/17/2023 0926   CALCIUM 9.3 06/17/2023 0926   PROT 6.1 06/17/2023 0926   ALBUMIN 4.3 01/30/2017 1108   AST 19 06/17/2023 0926   ALT 14 06/17/2023 0926   ALKPHOS 65 01/30/2017 1108   BILITOT 0.4 06/17/2023 0926   GFRNONAA 100 04/03/2021 1015   GFRAA 116 04/03/2021 1015     Lab Results  Component Value Date   WBC 5.7 06/17/2023   HGB 13.2 06/17/2023   HCT 39.5 06/17/2023   MCV 94.0 06/17/2023   PLT 222 06/17/2023    Lab Results  Component Value Date   CHOL 127 06/17/2023   HDL 49 06/17/2023   LDLCALC 58 06/17/2023   TRIG 112 06/17/2023   CHOLHDL 2.6 06/17/2023    No results found for: "HGBA1C"   Lab Results  Component Value Date   TSH 1.31 02/20/2018     Lab Results  Component Value Date   PSA 0.22 06/17/2023   PSA 0.22 04/09/2022   PSA 0.16 04/03/2021    Assessment & Plan:   Dependent edema: treated with  furosemide 40 mg daily.   Hyperlipidemia: treated with rosuvastatin 5 mg daily. Lipid panel normal.  Seborrhea face: treated with Nizoral cream.  Musculoskeletal pain: treated with meloxicam 15 mg daily.  GERD: treated with omeprazole 20 mg daily.  Cerebral palsy with spastic quadriplegia-uses motorized wheelchair.  Cerebral palsy thought to be due to Rh incompatibility complication of birth.  He has to be fed due to history of significant spasticity and has decreased range of motion of the right upper extremity.  He is right-handed.  He had cervical neck surgery in 28 and lost some ability to use his right upper extremity after that time.  He had C-spine decompression and fusion from C2-T1 for cervical spondylosis with myelopathy in April 2008 at [facility]  History of kidney stone status post laser lithotripsy and cystoscopy in March 2020 presenting as elevated serum creatinine and pain.  Had a 5 x 7 calculus in distal left ureter about 3 cm proximal to the left UVJ.  History of constipation treated with fiber and MiraLAX  History of adhesive capsulitis right shoulder 2009  Anxiety-treated with Paxil.  This helped his agitation.  I think he gets frustrated sometimes as he has difficulty communicating with others due to speech impediment.  Urinalysis normal.  Negative Cologuard 03/03/23.  Vaccine counseling: he will go to the pharmacy for the shingles vaccine. UTD on flu, tetanus vaccines.   Return in 1 year for health maintenance exam or as needed.     Annual wellness visit done today including the all of the following: Reviewed patient's Family Medical History Reviewed and updated list of patient's medical providers Assessment of cognitive impairment was done Assessed patient's functional ability Established a written schedule for health screening services Health Risk Assessent Completed and Reviewed  Discussed health benefits of physical activity, and  encouraged him to engage in regular exercise appropriate for his age  and condition.        I,Dennis Walsh,acting as a Neurosurgeon for Dennis Mackintosh, MD.,have documented all relevant documentation on the behalf of Dennis Mackintosh, MD,as directed by  Dennis Mackintosh, MD while in the presence of Dennis Mackintosh, MD.   I, Dennis Mackintosh, MD, have reviewed all documentation for this visit. The documentation on 07/06/23 for the exam, diagnosis, procedures, and orders are all accurate and complete.

## 2023-06-17 ENCOUNTER — Other Ambulatory Visit: Payer: Medicare Other

## 2023-06-17 DIAGNOSIS — E7800 Pure hypercholesterolemia, unspecified: Secondary | ICD-10-CM

## 2023-06-17 DIAGNOSIS — G800 Spastic quadriplegic cerebral palsy: Secondary | ICD-10-CM

## 2023-06-17 DIAGNOSIS — N400 Enlarged prostate without lower urinary tract symptoms: Secondary | ICD-10-CM

## 2023-06-17 DIAGNOSIS — Z125 Encounter for screening for malignant neoplasm of prostate: Secondary | ICD-10-CM

## 2023-06-18 LAB — COMPLETE METABOLIC PANEL WITH GFR
AG Ratio: 1.9 (calc) (ref 1.0–2.5)
ALT: 14 U/L (ref 9–46)
AST: 19 U/L (ref 10–35)
Albumin: 4 g/dL (ref 3.6–5.1)
Alkaline phosphatase (APISO): 79 U/L (ref 35–144)
BUN/Creatinine Ratio: 37 (calc) — ABNORMAL HIGH (ref 6–22)
BUN: 25 mg/dL (ref 7–25)
CO2: 28 mmol/L (ref 20–32)
Calcium: 9.3 mg/dL (ref 8.6–10.3)
Chloride: 102 mmol/L (ref 98–110)
Creat: 0.68 mg/dL — ABNORMAL LOW (ref 0.70–1.30)
Globulin: 2.1 g/dL (calc) (ref 1.9–3.7)
Glucose, Bld: 90 mg/dL (ref 65–99)
Potassium: 4.5 mmol/L (ref 3.5–5.3)
Sodium: 141 mmol/L (ref 135–146)
Total Bilirubin: 0.4 mg/dL (ref 0.2–1.2)
Total Protein: 6.1 g/dL (ref 6.1–8.1)
eGFR: 109 mL/min/{1.73_m2} (ref >=60)

## 2023-06-18 LAB — CBC WITH DIFFERENTIAL/PLATELET
Absolute Monocytes: 399 cells/uL (ref 200–950)
Basophils Absolute: 40 cells/uL (ref 0–200)
Basophils Relative: 0.7 %
Eosinophils Absolute: 200 cells/uL (ref 15–500)
Eosinophils Relative: 3.5 %
HCT: 39.5 % (ref 38.5–50.0)
Hemoglobin: 13.2 g/dL (ref 13.2–17.1)
Lymphs Abs: 1744 cells/uL (ref 850–3900)
MCH: 31.4 pg (ref 27.0–33.0)
MCHC: 33.4 g/dL (ref 32.0–36.0)
MCV: 94 fL (ref 80.0–100.0)
MPV: 12.4 fL (ref 7.5–12.5)
Monocytes Relative: 7 %
Neutro Abs: 3317 cells/uL (ref 1500–7800)
Neutrophils Relative %: 58.2 %
Platelets: 222 10*3/uL (ref 140–400)
RBC: 4.2 10*6/uL (ref 4.20–5.80)
RDW: 13.3 % (ref 11.0–15.0)
Total Lymphocyte: 30.6 %
WBC: 5.7 10*3/uL (ref 3.8–10.8)

## 2023-06-18 LAB — PSA: PSA: 0.22 ng/mL (ref <=4.00)

## 2023-06-18 LAB — LIPID PANEL
Cholesterol: 127 mg/dL (ref <200)
HDL: 49 mg/dL (ref >=40)
LDL Cholesterol (Calc): 58 mg/dL (calc)
Non-HDL Cholesterol (Calc): 78 mg/dL (calc) (ref <130)
Total CHOL/HDL Ratio: 2.6 (calc) (ref <5.0)
Triglycerides: 112 mg/dL (ref <150)

## 2023-06-22 DIAGNOSIS — Z0289 Encounter for other administrative examinations: Secondary | ICD-10-CM

## 2023-06-23 ENCOUNTER — Encounter: Payer: Self-pay | Admitting: Internal Medicine

## 2023-06-23 ENCOUNTER — Ambulatory Visit (INDEPENDENT_AMBULATORY_CARE_PROVIDER_SITE_OTHER): Payer: Medicare Other | Admitting: Internal Medicine

## 2023-06-23 VITALS — BP 128/70 | HR 74 | Resp 16 | Ht 68.0 in | Wt 150.0 lb

## 2023-06-23 DIAGNOSIS — Z993 Dependence on wheelchair: Secondary | ICD-10-CM

## 2023-06-23 DIAGNOSIS — H9193 Unspecified hearing loss, bilateral: Secondary | ICD-10-CM | POA: Diagnosis not present

## 2023-06-23 DIAGNOSIS — G800 Spastic quadriplegic cerebral palsy: Secondary | ICD-10-CM

## 2023-06-23 DIAGNOSIS — Z87442 Personal history of urinary calculi: Secondary | ICD-10-CM | POA: Diagnosis not present

## 2023-06-23 DIAGNOSIS — M7918 Myalgia, other site: Secondary | ICD-10-CM

## 2023-06-23 DIAGNOSIS — E7800 Pure hypercholesterolemia, unspecified: Secondary | ICD-10-CM

## 2023-06-23 DIAGNOSIS — Z0000 Encounter for general adult medical examination without abnormal findings: Secondary | ICD-10-CM | POA: Diagnosis not present

## 2023-06-23 DIAGNOSIS — Z8719 Personal history of other diseases of the digestive system: Secondary | ICD-10-CM

## 2023-06-23 LAB — POCT URINALYSIS DIPSTICK
Bilirubin, UA: NEGATIVE
Blood, UA: NEGATIVE
Glucose, UA: NEGATIVE
Ketones, UA: NEGATIVE
Leukocytes, UA: NEGATIVE
Nitrite, UA: NEGATIVE
Protein, UA: NEGATIVE
Spec Grav, UA: 1.015 (ref 1.010–1.025)
Urobilinogen, UA: 0.2 E.U./dL
pH, UA: 6 (ref 5.0–8.0)

## 2023-06-28 ENCOUNTER — Other Ambulatory Visit: Payer: Self-pay | Admitting: Internal Medicine

## 2023-07-06 NOTE — Patient Instructions (Addendum)
Apparently Cologuard specimen was not collected last year after his last physical exam by group home staff.  This will be reordered.  Labs are stable.  He seems to be doing reasonably well and may return in 1 year or as needed.  Forms completed as needed for his disability issues.  He needs to get annual flu vaccine through group home.  Tetanus and pneumococcal 20 vaccine are up-to-date.

## 2023-08-21 ENCOUNTER — Telehealth: Payer: Self-pay

## 2023-08-21 MED ORDER — HYDROCORTISONE (PERIANAL) 2.5 % EX CREA: 1.0000 | TOPICAL_CREAM | Freq: Three times a day (TID) | CUTANEOUS | 0 refills | Status: DC

## 2023-08-21 NOTE — Telephone Encounter (Signed)
Lexus from the group home called states patient is having a hemorrhoid flare, it started a week ago he bled on the first day only, he is currently not bleeding but the hemorrhoids are swollen. She wants to know if you can send something in for him [facility] pharmacy or does he need to come in?

## 2023-08-22 NOTE — Telephone Encounter (Signed)
RX was sent I tried calling [employer] and left a voicemail to call me back.

## 2023-09-11 ENCOUNTER — Telehealth: Payer: Self-pay | Admitting: Internal Medicine

## 2023-09-11 NOTE — Telephone Encounter (Signed)
This message was sent via FAXCOM, a product from [employer]. http://www.biscom.com/                    -------Fax Transmission Report-------  To:               Recipient at [phone] Subject:          Fw: Hp Scans Result:           The transmission was successful. Explanation:      All Pages Ok Pages Sent:       3 Connect Time:     1 minutes, 46 seconds Transmit Time:    09/11/2023 09:39 Transfer Rate:    14400 Status Code:      0000 Retry Count:      0 Job Id:           [id number] Unique Id:        [id number] Fax Line:         55 Fax Server:       [id number]

## 2023-09-11 NOTE — Telephone Encounter (Signed)
Received Form for Wheelchair repairs from [employer] [phone] Fax [phone]  Dr Lenord Fellers signed and we faxed back.

## 2023-09-23 ENCOUNTER — Other Ambulatory Visit: Payer: Self-pay | Admitting: Internal Medicine

## 2023-09-23 MED ORDER — HYDROCORTISONE (PERIANAL) 2.5 % EX CREA: 1.0000 | TOPICAL_CREAM | Freq: Three times a day (TID) | CUTANEOUS | 99 refills | Status: DC

## 2023-09-23 NOTE — Telephone Encounter (Signed)
Lexis - Group Home  Lexis from the group home was calling to see if you wanted Dennis Walsh to be able to have the cortisone cream on a PRN basis so they can keep it on hand or call when he needs it. If the can keep on hand he needs a new prescription that says it is for PRN use.

## 2023-09-23 NOTE — Telephone Encounter (Signed)
Nurse wants to know if they can have PRN refills so they can keep the hydrocortisone (ANUSOL-HC) 2.5% rectal cream

## 2023-10-14 ENCOUNTER — Other Ambulatory Visit: Payer: Self-pay | Admitting: Internal Medicine

## 2024-02-05 ENCOUNTER — Telehealth: Payer: Self-pay | Admitting: Internal Medicine

## 2024-02-05 NOTE — Telephone Encounter (Signed)
-------  Fax Transmission Report-------  To:               Recipient at [phone] Subject:          Fw: Hp Scans Result:           The transmission was successful. Explanation:      All Pages Ok Pages Sent:       3 Connect Time:     1 minutes, 34 seconds Transmit Time:    02/03/2024 17:40 Transfer Rate:    14400 Status Code:      0000 Retry Count:      0 Job Id:           [id number] Unique Id:        [id number] Fax Line:         30 Fax Server:       [employer]

## 2024-02-05 NOTE — Telephone Encounter (Signed)
 Received form to fill out and sign and fax back to Elayne Snare at [employer] [phone], phone [phone]

## 2024-02-16 ENCOUNTER — Other Ambulatory Visit: Payer: Self-pay | Admitting: Internal Medicine

## 2024-03-01 ENCOUNTER — Ambulatory Visit: Admitting: Internal Medicine

## 2024-03-01 NOTE — Progress Notes (Shared)
 Dennis Care Team: Margaree Mackintosh, MD as PCP - General (Internal Medicine)  Visit Date: 03/02/24  Subjective:   Chief Complaint  Dennis presents with   Medication Management  Dennis ZO:XWRUE L Skalski Jr.,Male DOB:08-21-1966,57 y.o. AVW:[id number]   58 y.o. Male, here with one of his caretakers in charge of transport, presents today for follow-up for Pain Management. Dennis has a past medical history of Cerebral Palsy, Dysarthria, and Spastic Quadriplegia. According to his caretaker, he has been doing well overall, eating regularly, and having bowel movements at least once daily in the morning (smaller) and one regular bowel movement once a week. History of Musculoskeletal Pain treated with Meloxicam 15 mg daily (morning, ~7 AM) and Tylenol PRN. He does mention his back aching while in office this morning.  Past Medical History:  Diagnosis Date   Arthritis    bil knees   Cerebral palsy, quadriplegic (HCC)    Hearing loss    au   Mild mental retardation    Seborrhea    facial  No Known Allergies  History reviewed. No pertinent family history. Social History   Social History Narrative   Not on file  Social history: His parents are divorced.  He has no siblings.  He does not smoke, use alcohol or illicit drugs.  He enjoys watching TV, drawing on his computer and enjoys watching [employer] basketball.  His affect is generally pleasant. As I understand it, his father is his guardian and his mother has his power of attorney.  Review of Systems  Musculoskeletal:  Positive for back pain.     Objective:  Vitals: BP 126/84   Pulse 97   Temp 98.4 F (36.9 C)   SpO2 98%   Physical Exam Constitutional:      General: He is not in acute distress.    Appearance: Normal appearance. He is not ill-appearing.  HENT:     Head: Normocephalic and atraumatic.     Right Ear: There is impacted cerumen.     Left Ear: There is impacted cerumen.     Mouth/Throat:     Pharynx: Oropharynx is clear.   Cardiovascular:     Rate and Rhythm: Normal rate and regular rhythm.     Pulses: Normal pulses.     Heart sounds: Normal heart sounds. No murmur heard.    No friction rub. No gallop.  Pulmonary:     Effort: Pulmonary effort is normal. No respiratory distress.     Breath sounds: Normal breath sounds. No wheezing or rales.  Abdominal:     Comments: Some stool retention noted  Skin:    General: Skin is warm and dry.  Neurological:     Mental Status: He is alert and oriented to person, place, and time. Mental status is at baseline.  Psychiatric:        Mood and Affect: Mood normal.        Behavior: Behavior normal.        Thought Content: Thought content normal.        Judgment: Judgment normal.     Results:  Studies Obtained And Personally Reviewed By Me: Labs:     Component Value Date/Time   NA 141 06/17/2023 0926   K 4.5 06/17/2023 0926   CL 102 06/17/2023 0926   CO2 28 06/17/2023 0926   GLUCOSE 90 06/17/2023 0926   BUN 25 06/17/2023 0926   CREATININE 0.68 (L) 06/17/2023 0926   CALCIUM 9.3 06/17/2023 0926   PROT 6.1 06/17/2023  0926   ALBUMIN 4.3 01/30/2017 1108   AST 19 06/17/2023 0926   ALT 14 06/17/2023 0926   ALKPHOS 65 01/30/2017 1108   BILITOT 0.4 06/17/2023 0926   GFRNONAA 100 04/03/2021 1015   GFRAA 116 04/03/2021 1015    Lab Results  Component Value Date   WBC 5.7 06/17/2023   HGB 13.2 06/17/2023   HCT 39.5 06/17/2023   MCV 94.0 06/17/2023   PLT 222 06/17/2023   Lab Results  Component Value Date   CHOL 127 06/17/2023   HDL 49 06/17/2023   LDLCALC 58 06/17/2023   TRIG 112 06/17/2023   CHOLHDL 2.6 06/17/2023   Lab Results  Component Value Date   TSH 1.31 02/20/2018    Lab Results  Component Value Date   PSA 0.22 06/17/2023   PSA 0.22 04/09/2022   PSA 0.16 04/03/2021     Assessment & Plan:   Musculoskeletal Pain treated with Meloxicam 15 mg daily (morning, ~7 AM) and Tylenol PRN.   Cerebral Palsy w/ Spastic Quadriplegia: uses motorized  wheelchair.  Cerebral palsy thought to be due to Rh incompatibility complication of birth.  He has to be fed due to history of significant spasticity and has decreased range of motion of the right upper extremity.  He is right-handed.  He had cervical neck surgery and lost some ability to use his right upper extremity after that time.  He had C-spine decompression and fusion from C2-T1 for cervical spondylosis with myelopathy in April 2008 at [facility].  Dysarthria related to his CP.   Constipation treated with fiber and MiraLAX. Having bowel movements at least once daily in the morning (smaller) and one regular bowel movement once a week.   I,Dennis Walsh,acting as a Neurosurgeon for Margaree Mackintosh, MD.,have documented all relevant documentation on the behalf of Margaree Mackintosh, MD,as directed by  Margaree Mackintosh, MD while in the presence of Margaree Mackintosh, MD.   ***

## 2024-03-02 ENCOUNTER — Ambulatory Visit (INDEPENDENT_AMBULATORY_CARE_PROVIDER_SITE_OTHER): Admitting: Internal Medicine

## 2024-03-02 ENCOUNTER — Encounter: Payer: Self-pay | Admitting: Internal Medicine

## 2024-03-02 VITALS — BP 126/84 | HR 97 | Temp 98.4°F

## 2024-03-02 DIAGNOSIS — G800 Spastic quadriplegic cerebral palsy: Secondary | ICD-10-CM

## 2024-03-02 DIAGNOSIS — Z8719 Personal history of other diseases of the digestive system: Secondary | ICD-10-CM

## 2024-03-02 DIAGNOSIS — Z87442 Personal history of urinary calculi: Secondary | ICD-10-CM

## 2024-03-02 DIAGNOSIS — M7918 Myalgia, other site: Secondary | ICD-10-CM | POA: Diagnosis not present

## 2024-03-02 DIAGNOSIS — Z993 Dependence on wheelchair: Secondary | ICD-10-CM | POA: Diagnosis not present

## 2024-03-02 DIAGNOSIS — H9193 Unspecified hearing loss, bilateral: Secondary | ICD-10-CM

## 2024-03-02 DIAGNOSIS — E7800 Pure hypercholesterolemia, unspecified: Secondary | ICD-10-CM

## 2024-03-03 NOTE — Patient Instructions (Signed)
 Patient seen and examined. Orders reviewed and signed and a copy placed in chart. He seems stable. RTC July 2025 for medicare wellness visit and CPE. Pain seems adequately controlled on current regimen.

## 2024-03-15 ENCOUNTER — Telehealth: Payer: Self-pay | Admitting: Internal Medicine

## 2024-03-15 NOTE — Telephone Encounter (Signed)
 Called Massey's father to get clarification on what is going on, since I could not get anyone at the [facility]. Dad said that on Saturday they went out to [place] to eat and Toran had some chicken, and later that night he had started  projectile vomiting twice and having abdomen pain, no fever.   He gave me the number for [facility] [phone]  I called Veryl Speak and she said he was better today, no abdomen pain, no fever, he has eaten. Does not feel he needs to be seen now, will call back to reschedule if anything changes.

## 2024-03-16 ENCOUNTER — Observation Stay (HOSPITAL_BASED_OUTPATIENT_CLINIC_OR_DEPARTMENT_OTHER)
Admission: EM | Admit: 2024-03-16 | Discharge: 2024-03-18 | Disposition: A | Discharge status: Home / Self Care | Attending: Family Medicine | Admitting: Family Medicine

## 2024-03-16 ENCOUNTER — Ambulatory Visit: Payer: Self-pay

## 2024-03-16 ENCOUNTER — Ambulatory Visit: Admitting: Internal Medicine

## 2024-03-16 ENCOUNTER — Emergency Department (HOSPITAL_BASED_OUTPATIENT_CLINIC_OR_DEPARTMENT_OTHER)

## 2024-03-16 ENCOUNTER — Other Ambulatory Visit: Payer: Self-pay

## 2024-03-16 ENCOUNTER — Encounter (HOSPITAL_BASED_OUTPATIENT_CLINIC_OR_DEPARTMENT_OTHER): Payer: Self-pay

## 2024-03-16 DIAGNOSIS — R1084 Generalized abdominal pain: Secondary | ICD-10-CM | POA: Insufficient documentation

## 2024-03-16 DIAGNOSIS — R933 Abnormal findings on diagnostic imaging of other parts of digestive tract: Secondary | ICD-10-CM | POA: Diagnosis not present

## 2024-03-16 DIAGNOSIS — K529 Noninfective gastroenteritis and colitis, unspecified: Secondary | ICD-10-CM | POA: Diagnosis not present

## 2024-03-16 DIAGNOSIS — G800 Spastic quadriplegic cerebral palsy: Secondary | ICD-10-CM | POA: Insufficient documentation

## 2024-03-16 DIAGNOSIS — R059 Cough, unspecified: Secondary | ICD-10-CM | POA: Insufficient documentation

## 2024-03-16 DIAGNOSIS — K5641 Fecal impaction: Secondary | ICD-10-CM | POA: Insufficient documentation

## 2024-03-16 DIAGNOSIS — Z466 Encounter for fitting and adjustment of urinary device: Secondary | ICD-10-CM | POA: Diagnosis not present

## 2024-03-16 DIAGNOSIS — R338 Other retention of urine: Secondary | ICD-10-CM | POA: Diagnosis not present

## 2024-03-16 DIAGNOSIS — R339 Retention of urine, unspecified: Secondary | ICD-10-CM | POA: Diagnosis not present

## 2024-03-16 DIAGNOSIS — R197 Diarrhea, unspecified: Secondary | ICD-10-CM

## 2024-03-16 DIAGNOSIS — E876 Hypokalemia: Secondary | ICD-10-CM

## 2024-03-16 DIAGNOSIS — Z79899 Other long term (current) drug therapy: Secondary | ICD-10-CM | POA: Insufficient documentation

## 2024-03-16 DIAGNOSIS — K219 Gastro-esophageal reflux disease without esophagitis: Secondary | ICD-10-CM | POA: Insufficient documentation

## 2024-03-16 DIAGNOSIS — Z9889 Other specified postprocedural states: Secondary | ICD-10-CM | POA: Insufficient documentation

## 2024-03-16 DIAGNOSIS — R112 Nausea with vomiting, unspecified: Secondary | ICD-10-CM | POA: Diagnosis present

## 2024-03-16 DIAGNOSIS — G808 Other cerebral palsy: Secondary | ICD-10-CM | POA: Diagnosis not present

## 2024-03-16 DIAGNOSIS — E611 Iron deficiency: Secondary | ICD-10-CM

## 2024-03-16 DIAGNOSIS — N390 Urinary tract infection, site not specified: Principal | ICD-10-CM | POA: Insufficient documentation

## 2024-03-16 LAB — COMPREHENSIVE METABOLIC PANEL WITH GFR
ALT: 14 U/L (ref 0–44)
AST: 18 U/L (ref 15–41)
Albumin: 4 g/dL (ref 3.5–5.0)
Alkaline Phosphatase: 65 U/L (ref 38–126)
Anion gap: 12 (ref 5–15)
BUN: 17 mg/dL (ref 6–20)
CO2: 24 mmol/L (ref 22–32)
Calcium: 8.3 mg/dL — ABNORMAL LOW (ref 8.9–10.3)
Chloride: 101 mmol/L (ref 98–111)
Creatinine, Ser: 0.67 mg/dL (ref 0.61–1.24)
GFR, Estimated: >60 mL/min (ref >60)
Glucose, Bld: 98 mg/dL (ref 70–99)
Potassium: 3.4 mmol/L — ABNORMAL LOW (ref 3.5–5.1)
Sodium: 137 mmol/L (ref 135–145)
Total Bilirubin: 0.3 mg/dL (ref 0.0–1.2)
Total Protein: 6.5 g/dL (ref 6.5–8.1)

## 2024-03-16 LAB — URINALYSIS, ROUTINE W REFLEX MICROSCOPIC
Bilirubin Urine: NEGATIVE
Glucose, UA: NEGATIVE mg/dL
Hgb urine dipstick: NEGATIVE
Ketones, ur: 40 mg/dL — AB
Leukocytes,Ua: NEGATIVE
Nitrite: NEGATIVE
Protein, ur: NEGATIVE mg/dL
Specific Gravity, Urine: 1.02 (ref 1.005–1.030)
pH: 5.5 (ref 5.0–8.0)

## 2024-03-16 LAB — CBC
HCT: 38.5 % — ABNORMAL LOW (ref 39.0–52.0)
Hemoglobin: 12.9 g/dL — ABNORMAL LOW (ref 13.0–17.0)
MCH: 31.5 pg (ref 26.0–34.0)
MCHC: 33.5 g/dL (ref 30.0–36.0)
MCV: 94.1 fL (ref 80.0–100.0)
Platelets: 208 10*3/uL (ref 150–400)
RBC: 4.09 MIL/uL — ABNORMAL LOW (ref 4.22–5.81)
RDW: 14.1 % (ref 11.5–15.5)
WBC: 6.3 10*3/uL (ref 4.0–10.5)
nRBC: 0 % (ref 0.0–0.2)

## 2024-03-16 LAB — RESP PANEL BY RT-PCR (RSV, FLU A&B, COVID)  RVPGX2
Influenza A by PCR: NEGATIVE
Influenza B by PCR: NEGATIVE
Resp Syncytial Virus by PCR: NEGATIVE
SARS Coronavirus 2 by RT PCR: NEGATIVE

## 2024-03-16 LAB — LIPASE, BLOOD: Lipase: 13 U/L (ref 11–51)

## 2024-03-16 MED ORDER — POLYETHYLENE GLYCOL 3350 17 G PO PACK: 17.0000 g | PACK | Freq: Every day | ORAL | Status: DC | PRN

## 2024-03-16 MED ORDER — PIPERACILLIN-TAZOBACTAM 3.375 G IVPB 30 MIN
3.3750 g | Freq: Once | INTRAVENOUS | Status: AC
  Administered 2024-03-16: 3.375 g via INTRAVENOUS
  Filled 2024-03-16: qty 50

## 2024-03-16 MED ORDER — ENOXAPARIN SODIUM 40 MG/0.4ML IJ SOSY
40.0000 mg | PREFILLED_SYRINGE | INTRAMUSCULAR | Status: DC
  Administered 2024-03-16 – 2024-03-17 (×2): 40 mg via SUBCUTANEOUS
  Filled 2024-03-16 (×2): qty 0.4

## 2024-03-16 MED ORDER — POTASSIUM CHLORIDE 20 MEQ PO PACK
20.0000 meq | PACK | Freq: Once | ORAL | Status: AC
  Administered 2024-03-16: 20 meq via ORAL
  Filled 2024-03-16: qty 1

## 2024-03-16 MED ORDER — IOHEXOL 300 MG/ML  SOLN
100.0000 mL | Freq: Once | INTRAMUSCULAR | Status: AC | PRN
  Administered 2024-03-16: 100 mL via INTRAVENOUS

## 2024-03-16 MED ORDER — BISACODYL 5 MG PO TBEC: 5.0000 mg | DELAYED_RELEASE_TABLET | Freq: Every day | ORAL | Status: DC | PRN

## 2024-03-16 MED ORDER — SODIUM CHLORIDE 0.9 % IV BOLUS
1000.0000 mL | Freq: Once | INTRAVENOUS | Status: AC
  Administered 2024-03-16: 1000 mL via INTRAVENOUS

## 2024-03-16 MED ORDER — ONDANSETRON HCL 4 MG/2ML IJ SOLN
4.0000 mg | Freq: Four times a day (QID) | INTRAMUSCULAR | Status: DC | PRN
  Administered 2024-03-16: 4 mg via INTRAVENOUS
  Filled 2024-03-16: qty 2

## 2024-03-16 MED ORDER — ONDANSETRON HCL 4 MG PO TABS: 4.0000 mg | ORAL_TABLET | Freq: Four times a day (QID) | ORAL | Status: DC | PRN

## 2024-03-16 MED ORDER — ACETAMINOPHEN 650 MG RE SUPP: 650.0000 mg | Freq: Four times a day (QID) | RECTAL | Status: DC | PRN

## 2024-03-16 MED ORDER — FENTANYL CITRATE PF 50 MCG/ML IJ SOSY
25.0000 ug | PREFILLED_SYRINGE | Freq: Once | INTRAMUSCULAR | Status: AC
  Administered 2024-03-16: 25 ug via INTRAVENOUS
  Filled 2024-03-16: qty 1

## 2024-03-16 MED ORDER — ACETAMINOPHEN 325 MG PO TABS: 650.0000 mg | ORAL_TABLET | Freq: Four times a day (QID) | ORAL | Status: DC | PRN

## 2024-03-16 MED ORDER — ONDANSETRON HCL 4 MG/2ML IJ SOLN
4.0000 mg | Freq: Once | INTRAMUSCULAR | Status: AC | PRN
  Administered 2024-03-16: 4 mg via INTRAVENOUS
  Filled 2024-03-16: qty 2

## 2024-03-16 NOTE — ED Provider Notes (Signed)
  Physical Exam  BP 122/80 (BP Location: Right Arm)   Pulse 81   Temp 98 F (36.7 C)   Resp 16   Ht 5\' 8"  (1.727 m)   SpO2 100%   BMI 22.81 kg/m   Physical Exam  Procedures  Procedures  ED Course / MDM   Clinical Course as of 03/16/24 1730  Tue Mar 16, 2024  1504 Assumed care from Dr. Karene Fry. 58 year old male with a history of spastic cp who is quadriplegic and kidney stones who presents emergency department with abdominal pain and nausea and vomiting. At baseline mental status. Had some vomiting and diarrhea. Having some abdominal discomfort. Retained 600 cc of urinary retention so foley was placed. Awaiting ct scan. Will need uro fu.  Thus far lab work is unremarkable including urinalysis.  COVID and flu negative.  Awaiting a stool sample. [RP]  1648 CT scan showing stercoral colitis.  Starting the patient on Zosyn and sending out blood cultures at this time. [RP]  1711 Dr Imogene Burn from hospitalist consulted for admission.  [RP]  1729 Patient disimpacted with only small amount of stool extracted.  Chaperoned by tech Tammy Sours [RP]    Clinical Course User Index [RP] Rondel Baton, MD   Medical Decision Making Amount and/or Complexity of Data Reviewed Labs: ordered. Radiology: ordered.  Risk Prescription drug management. Decision regarding hospitalization.     Rondel Baton, MD 03/16/24 [phone]

## 2024-03-16 NOTE — H&P (Signed)
 History and Physical    Dennis Walsh. XBM:[id number] DOB: 03/03/1966 DOA: 03/16/2024  PCP: Margaree Mackintosh, MD   Patient coming from: Home   Chief Complaint: Abdominal pain, N/V/D   HPI: Dennis Walsh. is a 58 y.o. male with medical history significant for cerebral palsy and chronic constipation who presents with abdominal pain, nausea, vomiting, and diarrhea.   He developed N/V on 03/14/2024 and was complaining of abdominal pain. He seemed to be back to baseline the following day but then developed diarrhea. He continued to have diarrhea until early this morning. Per report of his mother, others at his group home are now having similar symptoms.   [facility] ED Course: Upon arrival to the ED, patient is found to be afebrile and saturating well on room air with normal HR and stable BP. Labs are most notable for normal creatinine, normal LFTs and lipase, potassium 3.4, and normal WBC. CT demonstrates gaseous distension of the colon with no surrounding inflammatory changes.    Foley catheter was placed in the ED for AUR, urine culture was collected, and the patient was treated with Zosyn, fentanyl, and Zofran. He was transferred to [facility].   Review of Systems:  ROS limited by the patient's clinical condition.  Past Medical History:  Diagnosis Date   Arthritis    bil knees   Cerebral palsy, quadriplegic (HCC)    Hearing loss    au   Mild mental retardation    Seborrhea    facial    Past Surgical History:  Procedure Laterality Date   CYSTOSCOPY/URETEROSCOPY/HOLMIUM LASER/STENT PLACEMENT Bilateral 03/04/2019   Procedure: CYSTOSCOPY BILATERAL RETROGRADE PYELOGRAMS; LEFT LASER LITHORTRIPSY; LEFT URETERAL STENT PLACEMENT;  Surgeon: Malen Gauze, MD;  Location: [facility];  Service: Urology;  Laterality: Bilateral;    Social History:   reports that he has never smoked. He has never used smokeless tobacco. He reports that he does not drink alcohol  and does not use drugs.  No Known Allergies  History reviewed. No pertinent family history.   Prior to Admission medications   Medication Sig Start Date End Date Taking? Authorizing Provider  acetaminophen (TYLENOL) 325 MG tablet TAKE 1 TABLET BY MOUTH EVERY DAY 04/15/23   Margaree Mackintosh, MD  acetaminophen (TYLENOL) 500 MG tablet USE AS DIRECTED AS NEEDED FOR ACHES, PAINS, OR FEVER PER STANDING ORDER 09/05/21   Margaree Mackintosh, MD  Cholecalciferol (VITAMIN D) 2000 units tablet Take 1 tablet (2,000 Units total) by mouth daily. 03/09/18   Margaree Mackintosh, MD  cyclobenzaprine (FLEXERIL) 10 MG tablet Take 1 tablet (10 mg total) by mouth 3 (three) times daily as needed for muscle spasms. 02/28/22   Margaree Mackintosh, MD  DIMETAPP COLD/ALLERGY 1-2.5 MG/5ML syrup USE AS DIRECTED AS NEEDED FOR COLD SYMPTOMS PER STANDING ORDERS 09/05/21   Margaree Mackintosh, MD  FEROSUL 325 (65 Fe) MG tablet TAKE 1 TABLET BY MOUTH EVERY DAY WITH BREAKFAST *DO NOT CRUSH* 04/15/23   Margaree Mackintosh, MD  FIBER-LAX 625 MG tablet TAKE 1 TABLET BY MOUTH TWICE DAILY AT 10AM & 5PM 04/15/23   Margaree Mackintosh, MD  furosemide (LASIX) 40 MG tablet TAKE 1 TABLET BY MOUTH EVERY DAY 04/15/23   Margaree Mackintosh, MD  hydrocortisone (ANUSOL-HC) 2.5 % rectal cream Place 1 Application rectally 3 (three) times daily. 09/23/23   Margaree Mackintosh, MD  hydrOXYzine (ATARAX/VISTARIL) 25 MG tablet TAKE 1 TABLET BY MOUTH THREE TIMES DAILY AS NEEDED  FOR ITCHING TAKE 2TABLETS (50MG ) BY MOUTH THREE TIMES DAILY AS NEEDED FOR ITCHING 10/02/16   Margaree Mackintosh, MD  ketoconazole (NIZORAL) 2 % cream APPLY TOPICALLY TO AFFECTED AREA(S) OF SEBORRHEA EVERY DAY 10/14/23   Margaree Mackintosh, MD  ketoconazole (NIZORAL) 2 % shampoo SHAMPOO SCALP AS DIRECTED ONCE WEEKLY 06/28/23   Margaree Mackintosh, MD  meloxicam (MOBIC) 15 MG tablet TAKE 1 TABLET BY MOUTH EVERY DAY 02/16/24   Margaree Mackintosh, MD  minocycline (MINOCIN) 100 MG capsule TAKE 1 CAPSULE BY MOUTH EVERY DAY FOR ACNE *DO NOT CRUSH*  04/15/23   Margaree Mackintosh, MD  mupirocin ointment (BACTROBAN) 2 % APPLY TO AFFECTED AREA PRESSURE SORE TWICE DAILY AS NEEDED FOR LESSIONS ON SKIN 02/10/20   Margaree Mackintosh, MD  omeprazole (PRILOSEC) 20 MG capsule TAKE 1 CAPSULE BY MOUTH EVERY DAY *DO NOT CRUSH* 04/15/23   Margaree Mackintosh, MD  PARoxetine (PAXIL) 10 MG tablet TAKE 1 TABLET BY MOUTH EVERY DAY 04/15/23   Baxley, Luanna Cole, MD  polyethylene glycol powder (GLYCOLAX/MIRALAX) powder MIX 1 CAPFUL (17 GRAMS) IN 8OZ OF BEVERAGE OF CHOICE & TAKE BY MOUTH EVERY DAY 02/10/18   Margaree Mackintosh, MD  rosuvastatin (CRESTOR) 5 MG tablet TAKE 1 TABLET BY MOUTH EVERY DAY 02/16/24   Margaree Mackintosh, MD  SYSTANE 0.4-0.3 % SOLN PLACE 1 DROP IN [place] EYE TWICE DAILY 02/15/22   Margaree Mackintosh, MD  tamsulosin (FLOMAX) 0.4 MG CAPS capsule TAKE 1 CAPSULE BY MOUTH EVERY DAY AFTER SUPPER 04/15/23   Margaree Mackintosh, MD    Physical Exam: Vitals:   03/16/24 1600 03/16/24 1700 03/16/24 1735 03/16/24 1833  BP: 131/80 122/80 130/82 (!) 147/88  Pulse: 84 81 87 93  Resp: 16 16 16 16   Temp:    97.8 F (36.6 C)  TempSrc:    Oral  SpO2: 99% 100% 98% 100%  Weight:    57.3 kg  Height:    5\' 5"  (1.651 m)    Constitutional: NAD, no diaphoresis   Eyes: PERTLA, lids and conjunctivae normal ENMT: Mucous membranes are moist. Posterior pharynx clear of any exudate or lesions.   Neck: supple, no masses  Respiratory: no wheezing, no crackles. No accessory muscle use.  Cardiovascular: S1 & S2 heard, regular rate and rhythm. No extremity edema.  Abdomen: Soft, no guarding. Bowel sounds active.  Musculoskeletal: no clubbing / cyanosis. Flexion contractures.   Skin: no significant rashes, lesions, ulcers. Warm, dry, well-perfused. Neurologic: Makes eye-contact. Moving b/l UEs spontaneously. No intelligible vocalizations.   Psychiatric: Calm. Cooperative.    Labs and Imaging on Admission: I have personally reviewed following labs and imaging studies  CBC: Recent Labs  Lab  03/16/24 1300  WBC 6.3  HGB 12.9*  HCT 38.5*  MCV 94.1  PLT 208   Basic Metabolic Panel: Recent Labs  Lab 03/16/24 1300  NA 137  K 3.4*  CL 101  CO2 24  GLUCOSE 98  BUN 17  CREATININE 0.67  CALCIUM 8.3*   GFR: Estimated Creatinine Clearance: 82.6 mL/min (by C-G formula based on SCr of 0.67 mg/dL). Liver Function Tests: Recent Labs  Lab 03/16/24 1300  AST 18  ALT 14  ALKPHOS 65  BILITOT 0.3  PROT 6.5  ALBUMIN 4.0   Recent Labs  Lab 03/16/24 1300  LIPASE 13   No results for input(s): "AMMONIA" in the last 168 hours. Coagulation Profile: No results for input(s): "INR", "PROTIME" in the last 168 hours. Cardiac Enzymes:  No results for input(s): "CKTOTAL", "CKMB", "CKMBINDEX", "TROPONINI" in the last 168 hours. BNP (last 3 results) No results for input(s): "PROBNP" in the last 8760 hours. HbA1C: No results for input(s): "HGBA1C" in the last 72 hours. CBG: No results for input(s): "GLUCAP" in the last 168 hours. Lipid Profile: No results for input(s): "CHOL", "HDL", "LDLCALC", "TRIG", "CHOLHDL", "LDLDIRECT" in the last 72 hours. Thyroid Function Tests: No results for input(s): "TSH", "T4TOTAL", "FREET4", "T3FREE", "THYROIDAB" in the last 72 hours. Anemia Panel: No results for input(s): "VITAMINB12", "FOLATE", "FERRITIN", "TIBC", "IRON", "RETICCTPCT" in the last 72 hours. Urine analysis:    Component Value Date/Time   COLORURINE YELLOW 03/16/2024 1351   APPEARANCEUR CLEAR 03/16/2024 1351   LABSPEC 1.020 03/16/2024 1351   PHURINE 5.5 03/16/2024 1351   GLUCOSEU NEGATIVE 03/16/2024 1351   HGBUR NEGATIVE 03/16/2024 1351   BILIRUBINUR NEGATIVE 03/16/2024 1351   BILIRUBINUR neg 06/23/2023 1442   KETONESUR 40 (A) 03/16/2024 1351   PROTEINUR NEGATIVE 03/16/2024 1351   UROBILINOGEN 0.2 06/23/2023 1442   NITRITE NEGATIVE 03/16/2024 1351   LEUKOCYTESUR NEGATIVE 03/16/2024 1351   Sepsis Labs: @LABRCNTIP (procalcitonin:4,lacticidven:4) ) Recent Results (from the  past 240 hours)  Resp panel by RT-PCR (RSV, Flu A&B, Covid) Anterior Nasal Swab     Status: None   Collection Time: 03/16/24  1:03 PM   Specimen: Anterior Nasal Swab  Result Value Ref Range Status   SARS Coronavirus 2 by RT PCR NEGATIVE NEGATIVE Final    Comment: (NOTE) SARS-CoV-2 target nucleic acids are NOT DETECTED.  The SARS-CoV-2 RNA is generally detectable in upper respiratory specimens during the acute phase of infection. The lowest concentration of SARS-CoV-2 viral copies this assay can detect is 138 copies/mL. A negative result does not preclude SARS-Cov-2 infection and should not be used as the sole basis for treatment or other patient management decisions. A negative result may occur with  improper specimen collection/handling, submission of specimen other than nasopharyngeal swab, presence of viral mutation(s) within the areas targeted by this assay, and inadequate number of viral copies(<138 copies/mL). A negative result must be combined with clinical observations, patient history, and epidemiological information. The expected result is Negative.  Fact Sheet for Patients:  BloggerCourse.com  Fact Sheet for Healthcare Providers:  SeriousBroker.it  This test is no t yet approved or cleared by the [place] FDA and  has been authorized for detection and/or diagnosis of SARS-CoV-2 by FDA under an Emergency Use Authorization (EUA). This EUA will remain  in effect (meaning this test can be used) for the duration of the COVID-19 declaration under Section 564(b)(1) of the Act, 21 U.S.C.section 360bbb-3(b)(1), unless the authorization is terminated  or revoked sooner.       Influenza A by PCR NEGATIVE NEGATIVE Final   Influenza B by PCR NEGATIVE NEGATIVE Final    Comment: (NOTE) The Xpert Xpress SARS-CoV-2/FLU/RSV plus assay is intended as an aid in the diagnosis of influenza from Nasopharyngeal swab specimens  and should not be used as a sole basis for treatment. Nasal washings and aspirates are unacceptable for Xpert Xpress SARS-CoV-2/FLU/RSV testing.  Fact Sheet for Patients: BloggerCourse.com  Fact Sheet for Healthcare Providers: SeriousBroker.it  This test is not yet approved or cleared by the [place] FDA and has been authorized for detection and/or diagnosis of SARS-CoV-2 by FDA under an Emergency Use Authorization (EUA). This EUA will remain in effect (meaning this test can be used) for the duration of the COVID-19 declaration under Section 564(b)(1) of the Act, 21 U.S.C.  section 360bbb-3(b)(1), unless the authorization is terminated or revoked.     Resp Syncytial Virus by PCR NEGATIVE NEGATIVE Final    Comment: (NOTE) Fact Sheet for Patients: BloggerCourse.com  Fact Sheet for Healthcare Providers: SeriousBroker.it  This test is not yet approved or cleared by the [place] FDA and has been authorized for detection and/or diagnosis of SARS-CoV-2 by FDA under an Emergency Use Authorization (EUA). This EUA will remain in effect (meaning this test can be used) for the duration of the COVID-19 declaration under Section 564(b)(1) of the Act, 21 U.S.C. section 360bbb-3(b)(1), unless the authorization is terminated or revoked.  Performed at [facility], [address], [place], [place] [address]      Radiological Exams on Admission: CT ABDOMEN PELVIS W CONTRAST Result Date: 03/16/2024 CLINICAL DATA:  Abdominal pain, cerebral palsy EXAM: CT ABDOMEN AND PELVIS WITH CONTRAST TECHNIQUE: Multidetector CT imaging of the abdomen and pelvis was performed using the standard protocol following bolus administration of intravenous contrast. RADIATION DOSE REDUCTION: This exam was performed according to the departmental dose-optimization program which includes  automated exposure control, adjustment of the mA and/or kV according to patient size and/or use of iterative reconstruction technique. CONTRAST:  OMNIPAQUE IOHEXOL 300 MG/ML  SOLN COMPARISON:  03/03/2019 FINDINGS: Lower chest: No pleural or pericardial effusion. Visualized lung bases clear. Hepatobiliary: 1 cm hepatic cyst in hepatic segment 5. No new liver lesion or biliary ductal dilatation. Gallbladder physiologically distended without calcified gallstones. Pancreas: Unremarkable. No pancreatic ductal dilatation or surrounding inflammatory changes. Spleen: Normal in size without focal abnormality. Adrenals/Urinary Tract: No adrenal mass. Symmetric renal enhancement without hydronephrosis or worrisome lesion. Urinary bladder decompressed by Foley catheter. Stomach/Bowel: Moderate hiatal hernia involving the fundus. The stomach is nondilated. Small bowel decompressed. 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 Appendix not identified. The colon is incompletely distended. There is gaseous dilatation of the distal sigmoid and fecal distention of the rectum , with rectal wall thickening, no adjacent inflammatory changes. Vascular/Lymphatic: Scattered calcified aortic plaque without aneurysm. Stable subcentimeter left para-aortic lymph nodes. Portal vein patent. Reproductive: Prostate is unremarkable. Other: No ascites.  No free air. 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 Stable lucent defect in the posterior right iliac wing. No acute findings. IMPRESSION: 1. Rectal fecal distention with wall thickening, suggesting stercoral colitis. 2. Moderate hiatal hernia. Electronically Signed   By: Corlis Leak M.D.   On: 03/16/2024 16:22    Assessment/Plan   1. Acute gastroenteritis  - Patient had N/V on 03/14/24 and diarrhea on 3/31 until early this am; other people at his group home are now having similar symptoms per  report of his mother  - Trial clears, advance diet as tolerated, monitor/replete electrolytes    2. Acute urinary retention  - Foley placed in ED; no infection or hematuria  - Urology follow-up recommended   3. Cerebral palsy  - Continue supportive care    4. Hypokalemia  - Replacing    DVT prophylaxis: Lovenox  Code Status: Full  Level of Care: Level of care: Med-Surg Family Communication: Mother at bedside  Disposition Plan:  Patient is from: Group home  Anticipated d/c is to: Group home  Anticipated d/c date is: 03/17/24  Patient currently: Pending tolerance of adequate oral intake  Consults called: None  Admission status: Observation     Briscoe Deutscher, MD [facility]  03/16/2024, 7:46 PM

## 2024-03-16 NOTE — ED Notes (Signed)
Report given to [facility] 

## 2024-03-16 NOTE — ED Notes (Signed)
 Dennis Walsh with cl called for transport

## 2024-03-16 NOTE — ED Provider Notes (Signed)
 [place] EMERGENCY DEPARTMENT AT [facility] Provider Note   CSN: [id number] Arrival date & time: 03/16/24  1142     History  Chief Complaint  Patient presents with   Abdominal Pain    Dennis Walsh. is a 58 y.o. male.   Abdominal Pain Associated symptoms: diarrhea, nausea and vomiting      58 year old male with medical history significant for quadriplegic cerebral palsy, kidney stones presenting to the emergency department with nausea and vomiting with associated diarrhea and abdominal discomfort.  The history was provided by the patient's mother as well as the patient's caregiver from his facility.  The patient had an episode of nausea and vomiting on Saturday but has had no further episodes since and has tolerated oral intake ever since.  He did have an episode of diarrhea that started last night.  No fevers or chills.  He been complaining of some abdominal discomfort and back pain, does have a history of kidney stones.  He is at his mental baseline per his mother and caregiver bedside.  He occasionally has difficulty urinating, no known history of urinary retention. No blood in the vomit or stool noted.  Home Medications Prior to Admission medications   Medication Sig Start Date End Date Taking? Authorizing Provider  acetaminophen (TYLENOL) 325 MG tablet TAKE 1 TABLET BY MOUTH EVERY DAY 04/15/23   Margaree Mackintosh, MD  acetaminophen (TYLENOL) 500 MG tablet USE AS DIRECTED AS NEEDED FOR ACHES, PAINS, OR FEVER PER STANDING ORDER 09/05/21   Margaree Mackintosh, MD  Cholecalciferol (VITAMIN D) 2000 units tablet Take 1 tablet (2,000 Units total) by mouth daily. 03/09/18   Margaree Mackintosh, MD  cyclobenzaprine (FLEXERIL) 10 MG tablet Take 1 tablet (10 mg total) by mouth 3 (three) times daily as needed for muscle spasms. 02/28/22   Margaree Mackintosh, MD  DIMETAPP COLD/ALLERGY 1-2.5 MG/5ML syrup USE AS DIRECTED AS NEEDED FOR COLD SYMPTOMS PER STANDING ORDERS 09/05/21   Margaree Mackintosh, MD   FEROSUL 325 (65 Fe) MG tablet TAKE 1 TABLET BY MOUTH EVERY DAY WITH BREAKFAST *DO NOT CRUSH* 04/15/23   Margaree Mackintosh, MD  FIBER-LAX 625 MG tablet TAKE 1 TABLET BY MOUTH TWICE DAILY AT 10AM & 5PM 04/15/23   Margaree Mackintosh, MD  furosemide (LASIX) 40 MG tablet TAKE 1 TABLET BY MOUTH EVERY DAY 04/15/23   Margaree Mackintosh, MD  hydrocortisone (ANUSOL-HC) 2.5 % rectal cream Place 1 Application rectally 3 (three) times daily. 09/23/23   Margaree Mackintosh, MD  hydrOXYzine (ATARAX/VISTARIL) 25 MG tablet TAKE 1 TABLET BY MOUTH THREE TIMES DAILY AS NEEDED FOR ITCHING TAKE 2TABLETS (50MG ) BY MOUTH THREE TIMES DAILY AS NEEDED FOR ITCHING 10/02/16   Margaree Mackintosh, MD  ketoconazole (NIZORAL) 2 % cream APPLY TOPICALLY TO AFFECTED AREA(S) OF SEBORRHEA EVERY DAY 10/14/23   Margaree Mackintosh, MD  ketoconazole (NIZORAL) 2 % shampoo SHAMPOO SCALP AS DIRECTED ONCE WEEKLY 06/28/23   Margaree Mackintosh, MD  meloxicam (MOBIC) 15 MG tablet TAKE 1 TABLET BY MOUTH EVERY DAY 02/16/24   Margaree Mackintosh, MD  minocycline (MINOCIN) 100 MG capsule TAKE 1 CAPSULE BY MOUTH EVERY DAY FOR ACNE *DO NOT CRUSH* 04/15/23   Margaree Mackintosh, MD  mupirocin ointment (BACTROBAN) 2 % APPLY TO AFFECTED AREA PRESSURE SORE TWICE DAILY AS NEEDED FOR LESSIONS ON SKIN 02/10/20   Margaree Mackintosh, MD  omeprazole (PRILOSEC) 20 MG capsule TAKE 1 CAPSULE BY MOUTH EVERY DAY *DO NOT  CRUSH* 04/15/23   Margaree Mackintosh, MD  PARoxetine (PAXIL) 10 MG tablet TAKE 1 TABLET BY MOUTH EVERY DAY 04/15/23   Margaree Mackintosh, MD  polyethylene glycol powder (GLYCOLAX/MIRALAX) powder MIX 1 CAPFUL (17 GRAMS) IN 8OZ OF BEVERAGE OF CHOICE & TAKE BY MOUTH EVERY DAY 02/10/18   Margaree Mackintosh, MD  rosuvastatin (CRESTOR) 5 MG tablet TAKE 1 TABLET BY MOUTH EVERY DAY 02/16/24   Margaree Mackintosh, MD  SYSTANE 0.4-0.3 % SOLN PLACE 1 DROP IN [facility] EYE TWICE DAILY 02/15/22   Margaree Mackintosh, MD  tamsulosin (FLOMAX) 0.4 MG CAPS capsule TAKE 1 CAPSULE BY MOUTH EVERY DAY AFTER SUPPER 04/15/23   Margaree Mackintosh, MD       Allergies    Patient has no known allergies.    Review of Systems   Review of Systems  Gastrointestinal:  Positive for abdominal pain, diarrhea, nausea and vomiting.  All other systems reviewed and are negative.   Physical Exam Updated Vital Signs BP (!) 152/90 (BP Location: Right Arm)   Pulse 94   Temp 98 F (36.7 C)   Resp 16   Ht 5\' 8"  (1.727 m)   SpO2 100%   BMI 22.81 kg/m  Physical Exam Vitals and nursing note reviewed.  Constitutional:      General: He is not in acute distress.    Comments: Patient is nonverbal and hearing impaired  HENT:     Head: Normocephalic and atraumatic.  Eyes:     Conjunctiva/sclera: Conjunctivae normal.  Cardiovascular:     Rate and Rhythm: Normal rate and regular rhythm.  Pulmonary:     Effort: Pulmonary effort is normal. No respiratory distress.     Breath sounds: Normal breath sounds.  Abdominal:     Palpations: Abdomen is soft.     Tenderness: There is generalized abdominal tenderness.  Musculoskeletal:        General: No swelling.     Cervical back: Neck supple.  Skin:    General: Skin is warm and dry.     Capillary Refill: Capillary refill takes less than 2 seconds.  Neurological:     Mental Status: He is alert.     Comments: Patient at mental baseline, spastic quadriplegia present     ED Results / Procedures / Treatments   Labs (all labs ordered are listed, but only abnormal results are displayed) Labs Reviewed  COMPREHENSIVE METABOLIC PANEL WITH GFR - Abnormal; Notable for the following components:      Result Value   Potassium 3.4 (*)    Calcium 8.3 (*)    All other components within normal limits  CBC - Abnormal; Notable for the following components:   RBC 4.09 (*)    Hemoglobin 12.9 (*)    HCT 38.5 (*)    All other components within normal limits  RESP PANEL BY RT-PCR (RSV, FLU A&B, COVID)  [id number]  GASTROINTESTINAL PANEL BY PCR, STOOL (REPLACES STOOL CULTURE)  C DIFFICILE QUICK SCREEN W PCR REFLEX     LIPASE, BLOOD  URINALYSIS, ROUTINE W REFLEX MICROSCOPIC    EKG None  Radiology No results found.  Procedures Procedures    Medications Ordered in ED Medications  ondansetron (ZOFRAN) injection 4 mg (has no administration in time range)  fentaNYL (SUBLIMAZE) injection 25 mcg (has no administration in time range)  sodium chloride 0.9 % bolus 1,000 mL (has no administration in time range)    ED Course/ Medical Decision Making/ A&P  Medical Decision Making Amount and/or Complexity of Data Reviewed Labs: ordered. Radiology: ordered.  Risk Prescription drug management.    58 year old male with medical history significant for quadriplegic cerebral palsy, kidney stones presenting to the emergency department with nausea and vomiting with associated diarrhea and abdominal discomfort.  The history was provided by the patient's mother as well as the patient's caregiver from his facility.  The patient had an episode of nausea and vomiting on Saturday but has had no further episodes since and has tolerated oral intake ever since.  He did have an episode of diarrhea that started last night.  No fevers or chills.  He been complaining of some abdominal discomfort and back pain, does have a history of kidney stones.  He is at his mental baseline per his mother and caregiver bedside.  He occasionally has difficulty urinating, no known history of urinary retention.  No blood in the vomit or stool noted.  On arrival, the patient was afebrile, not tachycardic or tachypneic, hemodynamically stable, BP 137/80, saturating 96% on room air.  Sinus rhythm noted on cardiac telemetry.  Physical exam significant for patient at mental baseline, neurologically at baseline, generalized abdominal tenderness present.   Differential diagnosis includes intra-abdominal infectious abnormality, gastroenteritis, bowel obstruction, urinary retention, UTI/pyelonephritis,  nephrolithiasis.  IV access was obtained and the patient was administered IV fentanyl, IV Zofran for nausea control, and IV fluid bolus for volume resuscitation.  Laboratory evaluation revealed lipase normal, CMP with only mild hypokalemia to 3.4, otherwise unremarkable, CBC without a leukocytosis, mild anemia 12.9 present.  C. difficile and GI pathogen panel ordered, urinalysis ordered, COVID, flu, RSV PCR testing ordered and pending.  CT of the abdomen pelvis was also ordered and results were pending at time of signout.  Plan at time of signout to reassess the patient, follow-up results of diagnostic testing, ultimate disposition pending results of testing and reassessment.  Signout given to Dr. Jarold Motto at 1500.   Final Clinical Impression(s) / ED Diagnoses Final diagnoses:  Nausea and vomiting, unspecified vomiting type  Diarrhea, unspecified type  Generalized abdominal pain    Rx / DC Orders ED Discharge Orders     None         Ernie Avena, MD 03/16/24 1347

## 2024-03-16 NOTE — Progress Notes (Signed)
 Patient Name: Dennis Walsh, Dennis Walsh DOB: 14-Mar-1966 MRN: [id number]  Transferring facility: [facility] Requesting provider: Jarold Motto, md Reason for transfer: urinary retention, colitis 59 yo WM with hx of cerebral palsy, wheelchair bound presents to ER with urinary retention and reported N/V. CT reports gaseous distension of sigmoid/rectum but no stool in rectum/sigmoid. no inflammatory changes.. WBC normal. EDP refuses to send pt back to SNF. pt has urinary retention requiring foley catheter insertion Going to: [facility] Admission Status: OBS Bed Type: med/surg

## 2024-03-16 NOTE — ED Notes (Addendum)
 Very large bladder volume on scan, more than volume recorded.

## 2024-03-16 NOTE — Telephone Encounter (Signed)
 Called and could not get Dennis Walsh on the phone, so I called Dennis Walsh and let him know that Dennis Walsh needs to got to the emergency room since he has had this abdomen pain of and on since Saturday night and we can not get any xrays or stat labs in the office. He verbalized understanding and said he would get in touch with Dennis Walsh.

## 2024-03-16 NOTE — Progress Notes (Signed)
 ED Pharmacy Antibiotic Sign Off An antibiotic consult was received from an ED provider for zosyn per pharmacy dosing for  IAI . A chart review was completed to assess appropriateness.  The following one time order(s) were placed per pharmacy consult:  zosyn 3.375g x 1 dose  Further antibiotic and/or antibiotic pharmacy consults should be ordered by the admitting provider if indicated.   Thank you for allowing pharmacy to be a part of this patient's care.   Delmar Landau, PharmD, BCPS 03/16/2024 5:12 PM ED Clinical Pharmacist -  [phone]

## 2024-03-16 NOTE — ED Notes (Signed)
 Pt aware of the need for a urine and stool.... Unable to currently provide the samples.Marland KitchenMarland Kitchen

## 2024-03-16 NOTE — Telephone Encounter (Signed)
 Copied from CRM [id number]. Topic: Clinical - Red Word Triage >> Mar 16, 2024  8:55 AM Ivette P wrote: [facility] that prompted transfer to Nurse Triage: - clean him 3-4 times during last two shifts. temp is fine 98.7  diarrea   Appt was scheduled yesterday but nurse reached out [employer] Ashir's father to get clarification on what is going on, since I could not get anyone at the [facility]. Dad said that on Saturday they went out to [place] to eat and Jacson had some chicken, and later that night he had started  projectile vomiting twice and having abdomen pain, no fever.    He gave me the number for Etta [phone]   I called Veryl Speak and she said he was better today, no abdomen pain, no fever, he has eaten. Does not feel he needs to be seen now, will call back to reschedule if anything changes."   Chief Complaint: Diarrhea  Symptoms: Abdominal Pain Frequency: ACUTE  Pertinent Negatives: Patient denies fever, coughing Disposition: [] ED /[] Urgent Care (no appt availability in office) / [x] Appointment(In office/virtual)/ []  [facility]/ [] Home Care/ [] Refused Recommended Disposition /[] [facility]/ []  Follow-up with PCP Additional Notes: Spoke with Etta for triage, The patient has been vomiting since Saturday along with abdominal pain. Last night the patient started experiencing diarrhea. The patient currently complains of abdominal pain this morning and says his stomach is hurting really bad. The patient has been able to eat and drink without difficulty. In office appointment made for today to see PCP.   Reason for Disposition  [1] MODERATE diarrhea (e.g., 4-6 times / day more than normal) AND [2] present > 48 hours (2 days)  Answer Assessment - Initial Assessment Questions 1. DIARRHEA SEVERITY: "How bad is the diarrhea?" "How many more stools have you had in the past 24 hours than normal?"    - NO DIARRHEA (SCALE 0)   - MILD (SCALE 1-3): Few loose or  mushy BMs; increase of 1-3 stools over normal daily number of stools; mild increase in ostomy output.   -  MODERATE (SCALE 4-7): Increase of 4-6 stools daily over normal; moderate increase in ostomy output.   -  SEVERE (SCALE 8-10; OR "WORST POSSIBLE"): Increase of 7 or more stools daily over normal; moderate increase in ostomy output; incontinence.     4  2. ONSET: "When did the diarrhea begin?"      Last night  3. BM CONSISTENCY: "How loose or watery is the diarrhea?"      Loose  4. VOMITING: "Are you also vomiting?" If Yes, ask: "How many times in the past 24 hours?"        5. ABDOMEN PAIN: "Are you having any abdomen pain?" If Yes, ask: "What does it feel like?" (e.g., crampy, dull, intermittent, constant)      Yes  6. ABDOMEN PAIN SEVERITY: If present, ask: "How bad is the pain?"  (e.g., Scale 1-10; mild, moderate, or severe)   - MILD (1-3): doesn't interfere with normal activities, abdomen soft and not tender to touch    - MODERATE (4-7): interferes with normal activities or awakens from sleep, abdomen tender to touch    - SEVERE (8-10): excruciating pain, doubled over, unable to do any normal activities       Severe  7. ORAL INTAKE: If vomiting, "Have you been able to drink liquids?" "How much liquids have you had in the past 24 hours?"  Yes  8. HYDRATION: "Any signs of dehydration?" (e.g., dry mouth [not just dry lips], too weak to stand, dizziness, new weight loss) "When did you last urinate?"     Yes  9. EXPOSURE: "Have you traveled to a foreign country recently?" "Have you been exposed to anyone with diarrhea?" "Could you have eaten any food that was spoiled?"     No  10. ANTIBIOTIC USE: "Are you taking antibiotics now or have you taken antibiotics in the past 2 months?"       *No Answer*  11. OTHER SYMPTOMS: "Do you have any other symptoms?" (e.g., fever, blood in stool)       Abdominal Pain, Nausea  Protocols used: Diarrhea-A-AH

## 2024-03-16 NOTE — ED Notes (Signed)
 Report given to the floor RN via chat.Marland KitchenMarland Kitchen

## 2024-03-16 NOTE — ED Triage Notes (Signed)
 Pt w/ cerebral palsy POV from facility with caregiver and mother, pt c/o abd pain since Saturday w/ NV, Diarrhea started last night. Pt also c/o back pain w/ hx of kidney stones. Pt CAO to his normal baseline per mother and caregiver

## 2024-03-17 DIAGNOSIS — R339 Retention of urine, unspecified: Secondary | ICD-10-CM | POA: Diagnosis not present

## 2024-03-17 DIAGNOSIS — R338 Other retention of urine: Secondary | ICD-10-CM | POA: Diagnosis not present

## 2024-03-17 LAB — CBC
HCT: 38.9 % — ABNORMAL LOW (ref 39.0–52.0)
Hemoglobin: 12.5 g/dL — ABNORMAL LOW (ref 13.0–17.0)
MCH: 31.2 pg (ref 26.0–34.0)
MCHC: 32.1 g/dL (ref 30.0–36.0)
MCV: 97 fL (ref 80.0–100.0)
Platelets: 218 10*3/uL (ref 150–400)
RBC: 4.01 MIL/uL — ABNORMAL LOW (ref 4.22–5.81)
RDW: 13.8 % (ref 11.5–15.5)
WBC: 7.9 10*3/uL (ref 4.0–10.5)
nRBC: 0 % (ref 0.0–0.2)

## 2024-03-17 LAB — BASIC METABOLIC PANEL WITH GFR
Anion gap: 13 (ref 5–15)
BUN: 14 mg/dL (ref 6–20)
CO2: 22 mmol/L (ref 22–32)
Calcium: 8.6 mg/dL — ABNORMAL LOW (ref 8.9–10.3)
Chloride: 103 mmol/L (ref 98–111)
Creatinine, Ser: 0.76 mg/dL (ref 0.61–1.24)
GFR, Estimated: >60 mL/min
Glucose, Bld: 83 mg/dL (ref 70–99)
Potassium: 3.8 mmol/L (ref 3.5–5.1)
Sodium: 138 mmol/L (ref 135–145)

## 2024-03-17 LAB — URINE CULTURE: Culture: NO GROWTH

## 2024-03-17 LAB — HIV ANTIBODY (ROUTINE TESTING W REFLEX): HIV Screen 4th Generation wRfx: NONREACTIVE

## 2024-03-17 LAB — MAGNESIUM: Magnesium: 1.7 mg/dL (ref 1.7–2.4)

## 2024-03-17 MED ORDER — ROSUVASTATIN CALCIUM 5 MG PO TABS
5.0000 mg | ORAL_TABLET | Freq: Every day | ORAL | Status: DC
Start: 2024-03-17 — End: 2024-03-18
  Administered 2024-03-17 – 2024-03-18 (×2): 5 mg via ORAL
  Filled 2024-03-17 (×2): qty 1

## 2024-03-17 MED ORDER — PAROXETINE HCL 10 MG PO TABS
10.0000 mg | ORAL_TABLET | Freq: Every day | ORAL | Status: DC
  Administered 2024-03-17 – 2024-03-18 (×2): 10 mg via ORAL
  Filled 2024-03-17 (×2): qty 1

## 2024-03-17 MED ORDER — TAMSULOSIN HCL 0.4 MG PO CAPS
0.4000 mg | ORAL_CAPSULE | Freq: Every evening | ORAL | Status: DC
  Administered 2024-03-17: 0.4 mg via ORAL
  Filled 2024-03-17: qty 1

## 2024-03-17 MED ORDER — PANTOPRAZOLE SODIUM 40 MG PO TBEC
40.0000 mg | DELAYED_RELEASE_TABLET | Freq: Every day | ORAL | Status: DC
  Administered 2024-03-17 – 2024-03-18 (×2): 40 mg via ORAL
  Filled 2024-03-17 (×2): qty 1

## 2024-03-17 MED ORDER — POLYETHYLENE GLYCOL 3350 17 G PO PACK
17.0000 g | PACK | Freq: Three times a day (TID) | ORAL | Status: DC
  Administered 2024-03-17 – 2024-03-18 (×3): 17 g via ORAL
  Filled 2024-03-17 (×3): qty 1

## 2024-03-17 MED ORDER — POLYVINYL ALCOHOL 1.4 % OP SOLN
2.0000 [drp] | Freq: Two times a day (BID) | OPHTHALMIC | Status: DC
  Administered 2024-03-17 – 2024-03-18 (×3): 2 [drp] via OPHTHALMIC
  Filled 2024-03-17: qty 15

## 2024-03-17 MED ORDER — HYDROCORTISONE (PERIANAL) 2.5 % EX CREA
1.0000 | TOPICAL_CREAM | Freq: Two times a day (BID) | CUTANEOUS | Status: DC
  Administered 2024-03-17: 1 via RECTAL
  Filled 2024-03-17: qty 28.35

## 2024-03-17 MED ORDER — POLYETHYLENE GLYCOL 3350 17 G PO PACK
17.0000 g | PACK | Freq: Two times a day (BID) | ORAL | Status: DC
  Administered 2024-03-17: 17 g via ORAL
  Filled 2024-03-17: qty 1

## 2024-03-17 MED ORDER — FUROSEMIDE 40 MG PO TABS
40.0000 mg | ORAL_TABLET | Freq: Every day | ORAL | Status: DC
  Administered 2024-03-17 – 2024-03-18 (×2): 40 mg via ORAL
  Filled 2024-03-17 (×2): qty 1

## 2024-03-17 NOTE — Care Management Obs Status (Signed)
 MEDICARE OBSERVATION STATUS NOTIFICATION   Patient Details  Name: Dennis Walsh. MRN: [id number] Date of Birth: 01/29/1966   Medicare Observation Status Notification Given:  Yes    Amada Jupiter, LCSW 03/17/2024, 3:52 PM

## 2024-03-17 NOTE — Progress Notes (Signed)
 PROGRESS NOTE    Dennis Walsh.  ZOX:[id number] DOB: 1966-07-21 DOA: 03/16/2024 PCP: Margaree Mackintosh, MD  Chief Complaint  Patient presents with   Abdominal Pain    Brief Narrative:   Dennis Walsh. is Dennis Walsh 58 y.o. male with medical history significant for cerebral palsy and chronic constipation who presents with abdominal pain, nausea, vomiting, and diarrhea.   Assessment & Plan:   Principal Problem:   Acute urinary retention Active Problems:   Cerebral palsy, quadriplegic (HCC)   Nausea vomiting and diarrhea   Hypokalemia  Acute Gastroenteritis Seems to have resolved.  Others at group home had similar symptoms notably. Currently on regular diet  Acute Urinary Retention S/p foley placement Will do trial of void prior to discharge, ideally, he'll discharge without Mirha Brucato catheter  Rectal Fecal Distension with Wall Thickening suggesting Stercoral Colitis Disimpacted by ED with small amount of stool extracted Aggressive bowel regimen   Cerebral Palsy Noted  Hypokalemia improved     DVT prophylaxis: lovenox Code Status: full Family Communication: none Disposition:   Status is: Observation The patient remains OBS appropriate and will d/c before 2 midnights.   Consultants:  none  Procedures:  none  Antimicrobials:  Anti-infectives (From admission, onward)    Start     Dose/Rate Route Frequency Ordered Stop   03/16/24 1715  piperacillin-tazobactam (ZOSYN) IVPB 3.375 g        3.375 g 100 mL/hr over 30 Minutes Intravenous  Once 03/16/24 1712 03/16/24 1805       Subjective: Nonverbal Discussed with mother at bedside, thinks he's back to baseline  Objective: Vitals:   03/17/24 0125 03/17/24 0647 03/17/24 0944 03/17/24 1354  BP: (!) 140/82 139/84 135/89 135/79  Pulse: 80 80 (!) 102 89  Resp: 18 18 18 18   Temp: 98.2 F (36.8 C) 97.6 F (36.4 C)  98.2 F (36.8 C)  TempSrc: Oral   Oral  SpO2: 100% 100% 100% 100%  Weight:      Height:         Intake/Output Summary (Last 24 hours) at 03/17/2024 1534 Last data filed at 03/17/2024 1403 Gross per 24 hour  Intake 1005.13 ml  Output 2650 ml  Net -1644.87 ml   Filed Weights   03/16/24 1833  Weight: 57.3 kg    Examination:  General exam: Appears calm and comfortable  Respiratory system: unlabored Cardiovascular system: RRR Gastrointestinal system: mildly distended, nontender Central nervous system: Alert.  Nonverbal.   Extremities: no LEE    Data Reviewed: I have personally reviewed following labs and imaging studies  CBC: Recent Labs  Lab 03/16/24 1300 03/17/24 0827  WBC 6.3 7.9  HGB 12.9* 12.5*  HCT 38.5* 38.9*  MCV 94.1 97.0  PLT 208 218    Basic Metabolic Panel: Recent Labs  Lab 03/16/24 1300 03/17/24 0827  NA 137 138  K 3.4* 3.8  CL 101 103  CO2 24 22  GLUCOSE 98 83  BUN 17 14  CREATININE 0.67 0.76  CALCIUM 8.3* 8.6*  MG  --  1.7    GFR: Estimated Creatinine Clearance: 82.6 mL/min (by C-G formula based on SCr of 0.76 mg/dL).  Liver Function Tests: Recent Labs  Lab 03/16/24 1300  AST 18  ALT 14  ALKPHOS 65  BILITOT 0.3  PROT 6.5  ALBUMIN 4.0    CBG: No results for input(s): "GLUCAP" in the last 168 hours.   Recent Results (from the past 240 hours)  Resp panel by RT-PCR (RSV, Flu  Randee Huston&B, Covid) Anterior Nasal Swab     Status: None   Collection Time: 03/16/24  1:03 PM   Specimen: Anterior Nasal Swab  Result Value Ref Range Status   SARS Coronavirus 2 by RT PCR NEGATIVE NEGATIVE Final    Comment: (NOTE) SARS-CoV-2 target nucleic acids are NOT DETECTED.  The SARS-CoV-2 RNA is generally detectable in upper respiratory specimens during the acute phase of infection. The lowest concentration of SARS-CoV-2 viral copies this assay can detect is 138 copies/mL. Keiyon Plack negative result does not preclude SARS-Cov-2 infection and should not be used as the sole basis for treatment or other patient management decisions. Jodye Scali negative result may occur  with  improper specimen collection/handling, submission of specimen other than nasopharyngeal swab, presence of viral mutation(s) within the areas targeted by this assay, and inadequate number of viral copies(<138 copies/mL). Radek Carnero negative result must be combined with clinical observations, patient history, and epidemiological information. The expected result is Negative.  Fact Sheet for Patients:  BloggerCourse.com  Fact Sheet for Healthcare Providers:  SeriousBroker.it  This test is no t yet approved or cleared by the [place] FDA and  has been authorized for detection and/or diagnosis of SARS-CoV-2 by FDA under an Emergency Use Authorization (EUA). This EUA will remain  in effect (meaning this test can be used) for the duration of the COVID-19 declaration under Section 564(b)(1) of the Act, 21 U.S.C.section 360bbb-3(b)(1), unless the authorization is terminated  or revoked sooner.       Influenza Cambryn Charters by PCR NEGATIVE NEGATIVE Final   Influenza B by PCR NEGATIVE NEGATIVE Final    Comment: (NOTE) The Xpert Xpress SARS-CoV-2/FLU/RSV plus assay is intended as an aid in the diagnosis of influenza from Nasopharyngeal swab specimens and should not be used as Anhar Mcdermott sole basis for treatment. Nasal washings and aspirates are unacceptable for Xpert Xpress SARS-CoV-2/FLU/RSV testing.  Fact Sheet for Patients: BloggerCourse.com  Fact Sheet for Healthcare Providers: SeriousBroker.it  This test is not yet approved or cleared by the [place] FDA and has been authorized for detection and/or diagnosis of SARS-CoV-2 by FDA under an Emergency Use Authorization (EUA). This EUA will remain in effect (meaning this test can be used) for the duration of the COVID-19 declaration under Section 564(b)(1) of the Act, 21 U.S.C. section 360bbb-3(b)(1), unless the authorization is terminated  or revoked.     Resp Syncytial Virus by PCR NEGATIVE NEGATIVE Final    Comment: (NOTE) Fact Sheet for Patients: BloggerCourse.com  Fact Sheet for Healthcare Providers: SeriousBroker.it  This test is not yet approved or cleared by the [place] FDA and has been authorized for detection and/or diagnosis of SARS-CoV-2 by FDA under an Emergency Use Authorization (EUA). This EUA will remain in effect (meaning this test can be used) for the duration of the COVID-19 declaration under Section 564(b)(1) of the Act, 21 U.S.C. section 360bbb-3(b)(1), unless the authorization is terminated or revoked.  Performed at [facility], [address], [place], [place] [address]          Radiology Studies: CT ABDOMEN PELVIS W CONTRAST Result Date: 03/16/2024 CLINICAL DATA:  Abdominal pain, cerebral palsy EXAM: CT ABDOMEN AND PELVIS WITH CONTRAST TECHNIQUE: Multidetector CT imaging of the abdomen and pelvis was performed using the standard protocol following bolus administration of intravenous contrast. RADIATION DOSE REDUCTION: This exam was performed according to the departmental dose-optimization program which includes automated exposure control, adjustment of the mA and/or kV according to patient size and/or use of iterative reconstruction technique.  CONTRAST:  OMNIPAQUE IOHEXOL 300 MG/ML  SOLN COMPARISON:  03/03/2019 FINDINGS: Lower chest: No pleural or pericardial effusion. Visualized lung bases clear. Hepatobiliary: 1 cm hepatic cyst in hepatic segment 5. No new liver lesion or biliary ductal dilatation. Gallbladder physiologically distended without calcified gallstones. Pancreas: Unremarkable. No pancreatic ductal dilatation or surrounding inflammatory changes. Spleen: Normal in size without focal abnormality. Adrenals/Urinary Tract: No adrenal mass. Symmetric renal enhancement without hydronephrosis or worrisome lesion.  Urinary bladder decompressed by Foley catheter. Stomach/Bowel: Moderate hiatal hernia involving the fundus. The stomach is nondilated. Small bowel decompressed. 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 Appendix not identified. The colon is incompletely distended. There is gaseous dilatation of the distal sigmoid and fecal distention of the rectum , with rectal wall thickening, no adjacent inflammatory changes. Vascular/Lymphatic: Scattered calcified aortic plaque without aneurysm. Stable subcentimeter left para-aortic lymph nodes. Portal vein patent. Reproductive: Prostate is unremarkable. Other: No ascites.  No free air. 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 Stable lucent defect in the posterior right iliac wing. No acute findings. IMPRESSION: 1. Rectal fecal distention with wall thickening, suggesting stercoral colitis. 2. Moderate hiatal hernia. Electronically Signed   By: Corlis Leak M.D.   On: 03/16/2024 16:22        Scheduled Meds:  enoxaparin (LOVENOX) injection  40 mg Subcutaneous Q24H   polyethylene glycol  17 g Oral BID   polyvinyl alcohol  2 drop Both Eyes BID   Continuous Infusions:   LOS: 0 days    Time spent: over 30 min    Lacretia Nicks, MD [facility]   To contact the attending provider between 7A-7P or the covering provider during after hours 7P-7A, please log into the web site [email or web address] and access using universal [place] password for that web site. If you do not have the password, please call the hospital operator.  03/17/2024, 3:34 PM

## 2024-03-17 NOTE — Plan of Care (Signed)
 ?  Problem: Clinical Measurements: ?Goal: Ability to maintain clinical measurements within normal limits will improve ?Outcome: Progressing ?Goal: Will remain free from infection ?Outcome: Progressing ?Goal: Diagnostic test results will improve ?Outcome: Progressing ?  ?

## 2024-03-18 ENCOUNTER — Telehealth: Payer: Self-pay | Admitting: Internal Medicine

## 2024-03-18 DIAGNOSIS — R338 Other retention of urine: Secondary | ICD-10-CM | POA: Diagnosis not present

## 2024-03-18 DIAGNOSIS — R339 Retention of urine, unspecified: Secondary | ICD-10-CM | POA: Diagnosis not present

## 2024-03-18 LAB — BASIC METABOLIC PANEL WITH GFR
Anion gap: 12 (ref 5–15)
BUN: 15 mg/dL (ref 6–20)
CO2: 22 mmol/L (ref 22–32)
Calcium: 8.3 mg/dL — ABNORMAL LOW (ref 8.9–10.3)
Chloride: 103 mmol/L (ref 98–111)
Creatinine, Ser: 0.68 mg/dL (ref 0.61–1.24)
GFR, Estimated: >60 mL/min (ref >60)
Glucose, Bld: 109 mg/dL — ABNORMAL HIGH (ref 70–99)
Potassium: 3.6 mmol/L (ref 3.5–5.1)
Sodium: 137 mmol/L (ref 135–145)

## 2024-03-18 LAB — CBC
HCT: 37.4 % — ABNORMAL LOW (ref 39.0–52.0)
Hemoglobin: 12.1 g/dL — ABNORMAL LOW (ref 13.0–17.0)
MCH: 30.9 pg (ref 26.0–34.0)
MCHC: 32.4 g/dL (ref 30.0–36.0)
MCV: 95.7 fL (ref 80.0–100.0)
Platelets: 227 10*3/uL (ref 150–400)
RBC: 3.91 MIL/uL — ABNORMAL LOW (ref 4.22–5.81)
RDW: 13.9 % (ref 11.5–15.5)
WBC: 6.4 10*3/uL (ref 4.0–10.5)
nRBC: 0 % (ref 0.0–0.2)

## 2024-03-18 MED ORDER — ACETAMINOPHEN 500 MG PO TABS: 500.0000 mg | ORAL_TABLET | Freq: Four times a day (QID) | ORAL | Status: AC | PRN

## 2024-03-18 MED ORDER — FERROUS SULFATE 325 (65 FE) MG PO TABS: 325.0000 mg | ORAL_TABLET | ORAL | Status: AC

## 2024-03-18 NOTE — Progress Notes (Signed)
 Patient and his mother was given DC instructions. All questions were answered and patient was taken to main entrance in his own wheelchair.

## 2024-03-18 NOTE — Plan of Care (Signed)

## 2024-03-18 NOTE — Discharge Summary (Signed)
 Physician Discharge Summary  Dennis Walsh. WUJ:[id number] DOB: October 30, 1966 DOA: 03/16/2024  PCP: Dennis Mackintosh, MD  Admit date: 03/16/2024 Discharge date: 03/18/2024  Time spent: 40 minutes  Recommendations for Outpatient Follow-up:  Follow outpatient CBC/CMP  Titrate miralax so that he has 1 soft bowel movement daily Iron decreased to every other day, follow iron def outpatient Follow with urology outpatient   Discharge Diagnoses:  Principal Problem:   Acute urinary retention Active Problems:   Cerebral palsy, quadriplegic (HCC)   Nausea vomiting and diarrhea   Hypokalemia   Discharge Condition: stable  Diet recommendation: heart healthy  Filed Weights   03/16/24 1833  Weight: 57.3 kg    History of present illness:   Dennis Walsh. is Dennis Walsh 58 y.o. male with medical history significant for cerebral palsy and chronic constipation who presents with abdominal pain, nausea, vomiting, and diarrhea.   Admitted with urinary retention and CT findings concerning for stercoral colitis.  Improved at time of discharge, see below for additional details.  Hospital Course:  Assessment and Plan:  Acute Gastroenteritis Seems to have resolved.  Others at group home had similar symptoms notably. Currently on regular diet   Acute Urinary Retention S/p foley placement Removed foley 4/2 Doing well without retention on 4/3   Rectal Fecal Distension with Wall Thickening suggesting Stercoral Colitis Constipation Disimpacted by ED with small amount of stool extracted Aggressive bowel regimen - would recommend titrating miralax so that he has 1 soft bowel movement daily at home (currently has very large bowel movement every 3-4 days - suspect he's chronically constipated - would be more aggressive with bowel regimen at home)   Cerebral Palsy Noted   Hypokalemia improved       Procedures: none   Consultations: none  Discharge Exam: Vitals:   03/18/24 0548 03/18/24  0550  BP: 117/79   Pulse: 82   Resp: 18   Temp:  98.6 F (37 C)  SpO2: 99%    Mom at bedside Nonverbal Discussed discharge plan  General: No acute distress. Cardiovascular: RRR Lungs: unlabored Abdomen: distended Neurological: nonverbal Extremities: No clubbing or cyanosis. No edema.  Discharge Instructions   Discharge Instructions     Ambulatory referral to Urology   Complete by: As directed    Call MD for:  difficulty breathing, headache or visual disturbances   Complete by: As directed    Call MD for:  extreme fatigue   Complete by: As directed    Call MD for:  hives   Complete by: As directed    Call MD for:  persistant dizziness or light-headedness   Complete by: As directed    Call MD for:  persistant nausea and vomiting   Complete by: As directed    Call MD for:  redness, tenderness, or signs of infection (pain, swelling, redness, odor or green/yellow discharge around incision site)   Complete by: As directed    Call MD for:  severe uncontrolled pain   Complete by: As directed    Call MD for:  temperature >100.4   Complete by: As directed    Diet - low sodium heart healthy   Complete by: As directed    Discharge instructions   Complete by: As directed    You were seen for urinary retention as well as nausea/vomiting/diarrhea.  You your diarrhea has resolved.  Your CT scan actually had findings concerning for severe constipation.  Continue your home bowel regimen.  Titrate the miralax for Dennis Walsh  goal of one soft bowel movement daily.  I'll decrease your iron to every other day as iron can contribute to constipation.  Your urinary retention is resolved.  You should follow up with urology outpatient.   Return for new, recurrent, or worsening symptoms.  Please ask your PCP to request records from this hospitalization so they know what was done and what the next steps will be.   Increase activity slowly   Complete by: As directed       Allergies as of 03/18/2024    No Known Allergies      Medication List     STOP taking these medications    HYDROcodone-acetaminophen 5-325 MG tablet Commonly known as: NORCO/VICODIN   hydrocortisone 2.5 % rectal cream Commonly known as: ANUSOL-HC   loperamide 2 MG tablet Commonly known as: IMODIUM Dennis Walsh       TAKE these medications    acetaminophen 500 MG tablet Commonly known as: TYLENOL Take 1 tablet (500 mg total) by mouth every 6 (six) hours as needed. What changed:  See the new instructions. Another medication with the same name was removed. Continue taking this medication, and follow the directions you see here.   cyclobenzaprine 10 MG tablet Commonly known as: FLEXERIL Take 1 tablet (10 mg total) by mouth 3 (three) times daily as needed for muscle spasms.   Dimetapp Cold/Allergy 1-2.5 MG/5ML syrup Generic drug: Brompheniramine-Phenylephrine USE AS DIRECTED AS NEEDED FOR COLD SYMPTOMS PER STANDING ORDERS   ferrous sulfate 325 (65 FE) MG tablet Commonly known as: FeroSul Take 1 tablet (325 mg total) by mouth every other day. Follow your iron deficiency with your PCP outpatient What changed:  medication strength See the new instructions.   Fiber-Lax 625 MG tablet Generic drug: polycarbophil TAKE 1 TABLET BY MOUTH TWICE DAILY AT 10AM & 5PM   fluticasone 0.05 % cream Commonly known as: CUTIVATE Apply 1 Application topically 2 (two) times daily as needed (scaly skin pacthes).   furosemide 40 MG tablet Commonly known as: LASIX TAKE 1 TABLET BY MOUTH EVERY DAY   guaifenesin 100 MG/5ML syrup Commonly known as: ROBITUSSIN Take by mouth. Take as directed as needed for cough   hydrOXYzine 25 MG tablet Commonly known as: ATARAX TAKE 1 TABLET BY MOUTH THREE TIMES DAILY AS NEEDED FOR ITCHING TAKE 2TABLETS (50MG ) BY MOUTH THREE TIMES DAILY AS NEEDED FOR ITCHING What changed: See the new instructions.   ketoconazole 2 % shampoo Commonly known as: NIZORAL SHAMPOO SCALP AS DIRECTED ONCE  WEEKLY What changed: See the new instructions.   ketoconazole 2 % cream Commonly known as: NIZORAL APPLY TOPICALLY TO AFFECTED AREA(S) OF SEBORRHEA EVERY DAY What changed: Another medication with the same name was changed. Make sure you understand how and when to take each.   LIQUID ANTACID PO Take by mouth. Use as directed as needed fro nausea/indigestion per standing order   meloxicam 15 MG tablet Commonly known as: MOBIC TAKE 1 TABLET BY MOUTH EVERY DAY   MILK OF MAGNESIA PO Take by mouth. Take as directed as needed for constipation per standing order   minocycline 100 MG capsule Commonly known as: MINOCIN TAKE 1 CAPSULE BY MOUTH EVERY DAY FOR ACNE *DO NOT CRUSH*   mupirocin ointment 2 % Commonly known as: BACTROBAN APPLY TO AFFECTED AREA PRESSURE SORE TWICE DAILY AS NEEDED FOR LESSIONS ON SKIN   omeprazole 20 MG capsule Commonly known as: PRILOSEC TAKE 1 CAPSULE BY MOUTH EVERY DAY *DO NOT CRUSH*   PARoxetine 10 MG  tablet Commonly known as: PAXIL TAKE 1 TABLET BY MOUTH EVERY DAY   polyethylene glycol powder 17 GM/SCOOP powder Commonly known as: GLYCOLAX/MIRALAX MIX 1 CAPFUL (17 GRAMS) IN 8OZ OF BEVERAGE OF CHOICE & TAKE BY MOUTH EVERY DAY   rosuvastatin 5 MG tablet Commonly known as: CRESTOR TAKE 1 TABLET BY MOUTH EVERY DAY   Systane 0.4-0.3 % Soln Generic drug: Polyethyl Glycol-Propyl Glycol PLACE 1 DROP IN EACH EYE TWICE DAILY   tamsulosin 0.4 MG Caps capsule Commonly known as: FLOMAX TAKE 1 CAPSULE BY MOUTH EVERY DAY AFTER SUPPER What changed: See the new instructions.   Triple Antibiotic Oint Apply 1 Application topically as needed (cuts/scrapes/scratches).   Vitamin D 50 MCG (2000 UT) tablet Take 1 tablet (2,000 Units total) by mouth daily.       No Known Allergies  Follow-up Information     Schedule an appointment as soon as possible for Jiovanni Heeter visit  with Lenord Fellers Luanna Cole, MD.   Specialty: Internal Medicine Contact information: [address] [place] [address] [phone]         Schedule an appointment as soon as possible for Omer Puccinelli visit  with [facility].   Contact information: [address] [place] [address] [phone]                 The results of significant diagnostics from this hospitalization (including imaging, microbiology, ancillary and laboratory) are listed below for reference.    Significant Diagnostic Studies: CT ABDOMEN PELVIS W CONTRAST Result Date: 03/16/2024 CLINICAL DATA:  Abdominal pain, cerebral palsy EXAM: CT ABDOMEN AND PELVIS WITH CONTRAST TECHNIQUE: Multidetector CT imaging of the abdomen and pelvis was performed using the standard protocol following bolus administration of intravenous contrast. RADIATION DOSE REDUCTION: This exam was performed according to the departmental dose-optimization program which includes automated exposure control, adjustment of the mA and/or kV according to patient size and/or use of iterative reconstruction technique. CONTRAST:  OMNIPAQUE IOHEXOL 300 MG/ML  SOLN COMPARISON:  03/03/2019 FINDINGS: Lower chest: No pleural or pericardial effusion. Visualized lung bases clear. Hepatobiliary: 1 cm hepatic cyst in hepatic segment 5. No new liver lesion or biliary ductal dilatation. Gallbladder physiologically distended without calcified gallstones. Pancreas: Unremarkable. No pancreatic ductal dilatation or surrounding inflammatory changes. Spleen: Normal in size without focal abnormality. Adrenals/Urinary Tract: No adrenal mass. Symmetric renal enhancement without hydronephrosis or worrisome lesion. Urinary bladder decompressed by Foley catheter. Stomach/Bowel: Moderate hiatal hernia involving the fundus. The stomach is nondilated. Small bowel decompressed. 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 Appendix not identified. The colon is incompletely  distended. There is gaseous dilatation of the distal sigmoid and fecal distention of the rectum , with rectal wall thickening, no adjacent inflammatory changes. Vascular/Lymphatic: Scattered calcified aortic plaque without aneurysm. Stable subcentimeter left para-aortic lymph nodes. Portal vein patent. Reproductive: Prostate is unremarkable. Other: No ascites.  No free air. 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 Stable lucent defect in the posterior right iliac wing. No acute findings. IMPRESSION: 1. Rectal fecal distention with wall thickening, suggesting stercoral colitis. 2. Moderate hiatal hernia. Electronically Signed   By: Corlis Leak M.D.   On: 03/16/2024 16:22    Microbiology: Recent Results (from the past 240 hours)  Resp panel by RT-PCR (RSV, Flu Khadeejah Castner&B, Covid) Anterior Nasal Swab     Status: None   Collection Time: 03/16/24  1:03 PM   Specimen: Anterior Nasal Swab  Result Value Ref Range Status   SARS Coronavirus 2 by RT PCR NEGATIVE NEGATIVE Final    Comment: (NOTE) SARS-CoV-2 target nucleic acids are NOT DETECTED.  The SARS-CoV-2 RNA is generally detectable in upper respiratory specimens during the acute phase of infection. The lowest concentration of SARS-CoV-2 viral copies this assay can detect is 138 copies/mL. Sonda Coppens negative result does not preclude SARS-Cov-2 infection and should not be used as the sole basis for treatment or other patient management decisions. Derl Abalos negative result may occur with  improper specimen collection/handling, submission of specimen other than nasopharyngeal swab, presence of viral mutation(s) within the areas targeted by this assay, and inadequate number of viral copies(<138 copies/mL). Alexandra Posadas negative result must be combined with clinical observations, patient history, and epidemiological information. The expected result is Negative.  Fact Sheet for Patients:  BloggerCourse.com  Fact Sheet for Healthcare  Providers:  SeriousBroker.it  This test is no t yet approved or cleared by the [place] FDA and  has been authorized for detection and/or diagnosis of SARS-CoV-2 by FDA under an Emergency Use Authorization (EUA). This EUA will remain  in effect (meaning this test can be used) for the duration of the COVID-19 declaration under Section 564(b)(1) of the Act, 21 U.S.C.section 360bbb-3(b)(1), unless the authorization is terminated  or revoked sooner.       Influenza Oran Dillenburg by PCR NEGATIVE NEGATIVE Final   Influenza B by PCR NEGATIVE NEGATIVE Final    Comment: (NOTE) The Xpert Xpress SARS-CoV-2/FLU/RSV plus assay is intended as an aid in the diagnosis of influenza from Nasopharyngeal swab specimens and should not be used as Ronnisha Felber sole basis for treatment. Nasal washings and aspirates are unacceptable for Xpert Xpress SARS-CoV-2/FLU/RSV testing.  Fact Sheet for Patients: BloggerCourse.com  Fact Sheet for Healthcare Providers: SeriousBroker.it  This test is not yet approved or cleared by the [place] FDA and has been authorized for detection and/or diagnosis of SARS-CoV-2 by FDA under an Emergency Use Authorization (EUA). This EUA will remain in effect (meaning this test can be used) for the duration of the COVID-19 declaration under Section 564(b)(1) of the Act, 21 U.S.C. section 360bbb-3(b)(1), unless the authorization is terminated or revoked.     Resp Syncytial Virus by PCR NEGATIVE NEGATIVE Final    Comment: (NOTE) Fact Sheet for Patients: BloggerCourse.com  Fact Sheet for Healthcare Providers: SeriousBroker.it  This test is not yet approved or cleared by the [place] FDA and has been authorized for detection and/or diagnosis of SARS-CoV-2 by FDA under an Emergency Use Authorization (EUA). This EUA will remain in effect (meaning this test can be  used) for the duration of the COVID-19 declaration under Section 564(b)(1) of the Act, 21 U.S.C. section 360bbb-3(b)(1), unless the authorization is terminated or revoked.  Performed at [facility], [address], [place], [place] [address]   Urine Culture     Status: None   Collection Time: 03/16/24  1:51 PM   Specimen: Urine, Catheterized  Result Value Ref Range Status   Specimen Description   Final    URINE, CATHETERIZED Performed at [facility], [address] [place], [place] [address]    Special Requests   Final    NONE Performed at [facility], [address], [place], [place] [address]    Culture   Final    NO GROWTH Performed at [facility], [address], [place], [place] [address]    Report Status 03/17/2024 FINAL  Final  Labs: Basic Metabolic Panel: Recent Labs  Lab 03/16/24 1300 03/17/24 0827 03/18/24 0445  NA 137 138 137  K 3.4* 3.8 3.6  CL 101 103 103  CO2 24 22 22   GLUCOSE 98 83 109*  BUN 17 14 15   CREATININE 0.67 0.76 0.68  CALCIUM 8.3* 8.6* 8.3*  MG  --  1.7  --    Liver Function Tests: Recent Labs  Lab 03/16/24 1300  AST 18  ALT 14  ALKPHOS 65  BILITOT 0.3  PROT 6.5  ALBUMIN 4.0   Recent Labs  Lab 03/16/24 1300  LIPASE 13   No results for input(s): "AMMONIA" in the last 168 hours. CBC: Recent Labs  Lab 03/16/24 1300 03/17/24 0827 03/18/24 0927  WBC 6.3 7.9 6.4  HGB 12.9* 12.5* 12.1*  HCT 38.5* 38.9* 37.4*  MCV 94.1 97.0 95.7  PLT 208 218 227   Cardiac Enzymes: No results for input(s): "CKTOTAL", "CKMB", "CKMBINDEX", "TROPONINI" in the last 168 hours. BNP: BNP (last 3 results) No results for input(s): "BNP" in the last 8760 hours.  ProBNP (last 3 results) No results for input(s): "PROBNP" in the last 8760 hours.  CBG: No results for input(s): "GLUCAP" in the last 168 hours.     Signed:  Lacretia Nicks MD.  [facility] 03/18/2024, 10:33 AM

## 2024-03-18 NOTE — TOC Transition Note (Signed)
 Transition of Care [facility]) - Discharge Note   Patient Details  Name: Dennis Walsh. MRN: [id number] Date of Birth: 1966/02/11  Transition of Care [facility]) CM/SW Contact:  Amada Jupiter, LCSW Phone Number: 03/18/2024, 9:46 AM   Clinical Narrative:    Met with pt and mother yesterday and today.  Mother confirms dc plan is for patient to return to group home and she notes MD is to medically clear pt for dc today.  Mother has been in contact with group home and notes that either social worker with group home or family will provide dc transportation.  No TOC needs identified.  Will sign off.   Final next level of care: Group Home Barriers to Discharge: No Barriers Identified   Patient Goals and CMS Choice Patient states their goals for this hospitalization and ongoing recovery are:: return to group home per mother          Discharge Placement                       Discharge Plan and Services Additional resources added to the After Visit Summary for                  DME Arranged: N/A DME Agency: NA                  Social Drivers of Health (SDOH) Interventions SDOH Screenings   Food Insecurity: Unknown (03/16/2024)  Housing: Patient Declined (03/16/2024)  Transportation Needs: Patient Declined (03/16/2024)  Utilities: Patient Declined (03/16/2024)  Depression (PHQ2-9): Low Risk  (11/28/2022)  Tobacco Use: Low Risk  (03/16/2024)     Readmission Risk Interventions     No data to display

## 2024-03-18 NOTE — Telephone Encounter (Signed)
 Called and left a voice message with Veryl Speak that I had scheduled Dennis Walsh for a hospital follow up, and she needed to schedule him an appointment with [facility] as soon as possible preferably before he comes to see Dr Lenord Fellers and to also make sure they follow the hospital discharge summary on the Miralax instructions.

## 2024-03-19 ENCOUNTER — Telehealth: Payer: Self-pay | Admitting: Internal Medicine

## 2024-03-19 NOTE — Telephone Encounter (Signed)
 Dennis Walsh  [phone]  Dennis Kobus called to get more information on the email she had received or phone call one. She wanted demographics on patient. She was saying there was a brand new program from offered thru [facility], they now have a Primary Care service that goes out to patients home. She has tried them with another client and it has really worked well for them.   Just need to put in order number Ref [id number] - Nurse  and put in note for them to access for new PCP to come out to home for care instead of patient having to go out to an office.  Dennis Kobus said to call her if you wanted more information.

## 2024-03-23 NOTE — Progress Notes (Signed)
 Patient Care Team: Sylvan Evener, MD as PCP - General (Internal Medicine)  Visit Date: 03/25/24  Subjective:   Chief Complaint  Patient presents with   Hospitalization Follow-up  Patient ZO:XWRUE L Zollinger Jr.,Male DOB:March 28, 1966,57 y.o. AVW:[id number]   58 y.o. Male, here with his mother and transport, presents today for hospital follow-up from 4/01 - 03/18/2024. Patient has a past medical history of Cerebral Palsy, Quadriplegic; Speech Impediment; Acute Urinary Retention; Nausea/Vomiting/Diarrhea; and Hypokalemia. He presented to the ED on April 1st with abdominal pain and N/V/D. Sinus rhythm on cardiac telemetry. Glucose was elevated at 109 on day of his discharge, but was normal the first two days. Magnesium normal. His urine was ketonic on admission, but his culture returned normal. He was also slightly hypokalemic on admission, but this stabilized. Calcium  remained low throughout his stay - trended from 8.3 to 8.6 and back down to 8.3. Respiratory virus panel negative for Covid-19, flu, RVS while his HIV screening was also negative. Lipase normal. Throughout his admission he was anemic, though his white count was never elevated. 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   Today, in-office his transport and mother provide majority of the history, describes his laxative regimen as taking FiberCon and 17 g of Miralax  daily. According to Dr. Larae Plaster note, it was recommended that he increase his Miralax  to 34 g daily and reduce his iron to every other day, however they say that since his discharge he has not been able to do this as his caregivers can only follow what is written on his medication sheet, so they need his provider to d/c his previous regimen and write out the new  instructions. They say that he is eating better, but is still having 1 bowel movement every 3 days. He does say that he is still having some abdominal pain.  Past Medical History:  Diagnosis Date   Arthritis    bil knees   Cerebral palsy, quadriplegic (HCC)    Hearing loss    au   Mild mental retardation    Seborrhea    facial  No Known Allergies  No family history on file. Social History   Social History Narrative   Not on file   Review of Systems  Gastrointestinal:  Positive for abdominal pain and constipation.     Objective:  Vitals: BP 122/82   Pulse 73   Temp 99.1 F (37.3 C) (Temporal)   Wt 148 lb (67.1 kg)   SpO2 97%   BMI 24.63 kg/m   Physical Exam Vitals and nursing note reviewed.  Constitutional:      General: He is not in acute distress.    Appearance: Normal appearance. He is not ill-appearing.  HENT:     Head: Normocephalic and atraumatic.     Nose:     Comments: Seborrhea of the nasolabial folds Pulmonary:     Effort: Pulmonary effort is normal.  Abdominal:     General: There is distension.     Palpations: Abdomen is rigid.  Skin:    General: Skin is warm and dry.  Neurological:     Mental Status: He is alert and oriented to person, place, and time. Mental status is at baseline.     Comments: At baseline for him  Psychiatric:  Mood and Affect: Mood normal.        Behavior: Behavior normal.        Thought Content: Thought content normal.        Judgment: Judgment normal.     Results:  Studies Obtained And Personally Reviewed By Me:  CT ABDOMEN AND PELVIS WITH CONTRAST 03/16/2024  COMPARISON:  03/03/2019   FINDINGS: Lower chest: No pleural or pericardial effusion. Visualized lung bases clear.   Hepatobiliary: 1 cm hepatic cyst in hepatic segment 5. No new liver lesion or biliary ductal dilatation. Gallbladder physiologically distended without calcified gallstones.   Pancreas: Unremarkable. No pancreatic ductal dilatation  or surrounding inflammatory changes.   Spleen: Normal in size without focal abnormality.   Adrenals/Urinary Tract: No adrenal mass. Symmetric renal enhancement without hydronephrosis or worrisome lesion. Urinary bladder decompressed by Foley catheter.   Stomach/Bowel: Moderate hiatal hernia involving the fundus. The stomach is nondilated. Small bowel decompressed. 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 Appendix not identified. The colon is incompletely distended. There is gaseous dilatation of the distal sigmoid and fecal distention of the rectum , with rectal wall thickening, no adjacent inflammatory changes.   Vascular/Lymphatic: Scattered calcified aortic plaque without aneurysm. Stable subcentimeter left para-aortic lymph nodes. Portal vein patent.   Reproductive: Prostate is unremarkable.   Other: No ascites.  No free air.   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 Stable lucent defect in the posterior right iliac wing. No acute findings.   IMPRESSION: 1. Rectal fecal distention with wall thickening, suggesting stercoral colitis. 2. Moderate hiatal hernia.  Labs:     Component Value Date/Time   NA 137 03/18/2024 0445   K 3.6 03/18/2024 0445   CL 103 03/18/2024 0445   CO2 22 03/18/2024 0445   GLUCOSE 109 (H) 03/18/2024 0445   BUN 15 03/18/2024 0445   CREATININE 0.68 03/18/2024 0445   CREATININE 0.68 (L) 06/17/2023 0926   CALCIUM  8.3 (L) 03/18/2024 0445   PROT 6.5 03/16/2024 1300   ALBUMIN 4.0 03/16/2024 1300   AST 18 03/16/2024 1300   ALT 14 03/16/2024 1300   ALKPHOS 65 03/16/2024 1300   BILITOT 0.3 03/16/2024 1300   GFRNONAA >60 03/18/2024 0445   GFRNONAA 100 04/03/2021 1015   GFRAA 116 04/03/2021 1015    Lab Results  Component Value Date   WBC 6.4 03/18/2024   HGB 12.1 (L) 03/18/2024   HCT 37.4 (L) 03/18/2024   MCV 95.7 03/18/2024   PLT 227  03/18/2024   Lab Results  Component Value Date   CHOL 127 06/17/2023   HDL 49 06/17/2023   LDLCALC 58 06/17/2023   TRIG 112 06/17/2023   CHOLHDL 2.6 06/17/2023   Lab Results  Component Value Date   TSH 1.31 02/20/2018    Lab Results  Component Value Date   PSA 0.22 06/17/2023   PSA 0.22 04/09/2022   PSA 0.16 04/03/2021   Assessment & Plan:   Orders Placed This Encounter  Procedures   CBC with Differential/Platelet   COMPLETE METABOLIC PANEL WITHOUT GFR   Sedimentation rate   TSH   T4, Free   AMB Referral [facility] Care Management  Hospital Follow-up for Stercoral Colitis; Hx of Constipation: Instructed to increase Miralax  to 17 g/1 capful twice daily and reduce Ferrous Sulfate  to every other day for increased bowel movements for maintenance of constipation. Chuxs pads ordered.   Spastic Quadriplegic Cerebral Palsy; Wheelchair Dependant: have ordered a hospital bed with  motorized controls. Discussed with his mother and caregiver representative about having his primary care done through [facility], who can do in-home visits per advice of [employer] agent Louanne Roussel, contacted at [phone].   History of Kidney Stones- none recently  Bilateral Hearing Loss w/ use of bilateral hearing aids.   Seborrheic Dermatitis: of the nasolabial folds. Instructed caregiver to use Nizoral 2% cream twice daily instead of once.   I,Emily Lagle,acting as a Neurosurgeon for Sylvan Evener, MD.,have documented all relevant documentation on the behalf of Sylvan Evener, MD,as directed by  Sylvan Evener, MD while in the presence of Sylvan Evener, MD.   I, Sylvan Evener, MD, have reviewed all documentation for this visit. The documentation on 04/04/24 for the exam, diagnosis, procedures, and orders are all accurate and complete.

## 2024-03-25 ENCOUNTER — Ambulatory Visit: Admitting: Internal Medicine

## 2024-03-25 VITALS — BP 122/82 | HR 73 | Temp 99.1°F | Wt 148.0 lb

## 2024-03-25 DIAGNOSIS — Z87442 Personal history of urinary calculi: Secondary | ICD-10-CM | POA: Diagnosis not present

## 2024-03-25 DIAGNOSIS — Z8719 Personal history of other diseases of the digestive system: Secondary | ICD-10-CM

## 2024-03-25 DIAGNOSIS — G800 Spastic quadriplegic cerebral palsy: Secondary | ICD-10-CM | POA: Diagnosis not present

## 2024-03-25 DIAGNOSIS — Z993 Dependence on wheelchair: Secondary | ICD-10-CM

## 2024-03-25 DIAGNOSIS — Z09 Encounter for follow-up examination after completed treatment for conditions other than malignant neoplasm: Secondary | ICD-10-CM

## 2024-03-25 DIAGNOSIS — L219 Seborrheic dermatitis, unspecified: Secondary | ICD-10-CM

## 2024-03-25 DIAGNOSIS — K5289 Other specified noninfective gastroenteritis and colitis: Secondary | ICD-10-CM | POA: Diagnosis not present

## 2024-03-25 DIAGNOSIS — H9193 Unspecified hearing loss, bilateral: Secondary | ICD-10-CM

## 2024-03-25 LAB — T4, FREE: Free T4: 1.3 ng/dL (ref 0.8–1.8)

## 2024-03-26 ENCOUNTER — Telehealth: Payer: Self-pay | Admitting: *Deleted

## 2024-03-26 LAB — COMPLETE METABOLIC PANEL WITHOUT GFR
AG Ratio: 1.8 (calc) (ref 1.0–2.5)
ALT: 20 U/L (ref 9–46)
AST: 22 U/L (ref 10–35)
Albumin: 4.2 g/dL (ref 3.6–5.1)
Alkaline phosphatase (APISO): 72 U/L (ref 35–144)
BUN: 16 mg/dL (ref 7–25)
CO2: 28 mmol/L (ref 20–32)
Calcium: 9.2 mg/dL (ref 8.6–10.3)
Chloride: 101 mmol/L (ref 98–110)
Creat: 0.8 mg/dL (ref 0.70–1.30)
Globulin: 2.3 g/dL (ref 1.9–3.7)
Glucose, Bld: 112 mg/dL — ABNORMAL HIGH (ref 65–99)
Potassium: 4.2 mmol/L (ref 3.5–5.3)
Sodium: 140 mmol/L (ref 135–146)
Total Bilirubin: 0.3 mg/dL (ref 0.2–1.2)
Total Protein: 6.5 g/dL (ref 6.1–8.1)

## 2024-03-26 LAB — CBC WITH DIFFERENTIAL/PLATELET
Absolute Lymphocytes: 1913 {cells}/uL (ref 850–3900)
Absolute Monocytes: 569 {cells}/uL (ref 200–950)
Basophils Absolute: 58 {cells}/uL (ref 0–200)
Basophils Relative: 0.8 %
Eosinophils Absolute: 168 {cells}/uL (ref 15–500)
Eosinophils Relative: 2.3 %
HCT: 39.8 % (ref 38.5–50.0)
Hemoglobin: 13 g/dL — ABNORMAL LOW (ref 13.2–17.1)
MCH: 31.3 pg (ref 27.0–33.0)
MCHC: 32.7 g/dL (ref 32.0–36.0)
MCV: 95.7 fL (ref 80.0–100.0)
MPV: 12.4 fL (ref 7.5–12.5)
Monocytes Relative: 7.8 %
Neutro Abs: 4592 {cells}/uL (ref 1500–7800)
Neutrophils Relative %: 62.9 %
Platelets: 303 10*3/uL (ref 140–400)
RBC: 4.16 10*6/uL — ABNORMAL LOW (ref 4.20–5.80)
RDW: 13.1 % (ref 11.0–15.0)
Total Lymphocyte: 26.2 %
WBC: 7.3 10*3/uL (ref 3.8–10.8)

## 2024-03-26 LAB — TSH: TSH: 2.18 m[IU]/L (ref 0.40–4.50)

## 2024-03-26 LAB — SEDIMENTATION RATE: Sed Rate: 6 mm/h (ref 0–20)

## 2024-03-26 NOTE — Progress Notes (Signed)
 Complex Care Management Note  Care Guide Note 03/26/2024 Name: Dennis Walsh. MRN: [id number] DOB: 12-18-1965  Dennis Walsh. is a 58 y.o. year old male who sees Baxley, Luanna Cole, MD for primary care. I reached out to Dennis Walsh. by phone today to offer complex care management services.  [name] was given information about Complex Care Management services today including:   The Complex Care Management services include support from the care team which includes your Nurse Care Manager, Clinical Social Worker, or Pharmacist.  The Complex Care Management team is here to help remove barriers to the health concerns and goals most important to you. Complex Care Management services are voluntary, and the patient may decline or stop services at any time by request to their care team member.   Complex Care Management Consent Status: Patient agreed to services and verbal consent obtained.   Follow up plan:  Telephone appointment with complex care management team member scheduled for:  4/17  Encounter Outcome:  Patient Scheduled  Gwenevere Ghazi  [facility]  [facility], [facility] Guide  Direct Dial: [phone]  Fax [phone]

## 2024-03-26 NOTE — Progress Notes (Signed)
 Complex Care Management Note Care Guide Note  03/26/2024 Name: Dennis Walsh. MRN: [id number] DOB: August 13, 1966   Complex Care Management Outreach Attempts: An unsuccessful telephone outreach was attempted today to offer the patient information about available complex care management services.  Follow Up Plan:  Additional outreach attempts will be made to offer the patient complex care management information and services.   Encounter Outcome:  No Answer  Gwenevere Ghazi  [facility]  [facility], [facility] Guide  Direct Dial: [phone]  Fax [phone]

## 2024-03-29 ENCOUNTER — Telehealth: Payer: Self-pay | Admitting: *Deleted

## 2024-03-29 NOTE — Telephone Encounter (Signed)
 Copied from CRM [id number]. Topic: Clinical - Lab/Test Results >> Mar 26, 2024 12:52 PM Ivette P wrote: Reason for CRM: PT Mom Hettie Lota) called due to missed call about lab results.   Read results as follows:  "Sylvan Evener, MD 03/26/2024  9:45 AM EDT   Please let his mother Hettie Lota know these labs are all stable . Her number is [phone]. She can let group home know."  Hettie Lota understood labs, no further questions.

## 2024-04-01 ENCOUNTER — Telehealth: Payer: Self-pay

## 2024-04-01 ENCOUNTER — Telehealth: Payer: Self-pay | Admitting: *Deleted

## 2024-04-01 NOTE — Progress Notes (Signed)
 Complex Care Management Care Guide Note  04/01/2024 Name: Dennis Walsh. MRN: [id number] DOB: 1966-02-04  Dennis Co. is a 58 y.o. year old male who is a primary care patient of Baxley, Jaynie Meyers, MD and is actively engaged with the care management team. I reached out to Dennis Co. by phone today to assist with re-scheduling  with the RN Case Manager.  Follow up plan: Unsuccessful telephone outreach attempt made. A HIPAA compliant phone message was left for the patient providing contact information and requesting a return call.  Dennis Walsh  [facility]  [facility], [facility] Guide  Direct Dial: [phone]  Fax [phone]

## 2024-04-01 NOTE — Progress Notes (Signed)
 Complex Care Management Care Guide Note  04/01/2024 Name: Robbin Escher. MRN: [id number] DOB: 11-15-1966  Romeo Co. is a 58 y.o. year old male who is a primary care patient of Baxley, Jaynie Meyers, MD and is actively engaged with the care management team. I reached out to Romeo Co. by phone today to assist with re-scheduling  with the RN Case Manager.  Follow up plan: Telephone appointment with complex care management team member scheduled for:  04/21/24  Barnie Bora  [facility]  [facility], [facility] Guide  Direct Dial: [phone]  Fax [phone]

## 2024-04-04 ENCOUNTER — Encounter: Payer: Self-pay | Admitting: Internal Medicine

## 2024-04-04 NOTE — Patient Instructions (Addendum)
 Reduce ferrous sulfate  to every other day to see if this helps constipation. Chux pads ordered per facility request. Also per facility request, have ordered hospital bed with motorized controls. Make referral to see if he can receive primary care visits through [facility] at his facility. Use Nizoral cream for seborrheic dermatitis on face.

## 2024-04-06 ENCOUNTER — Telehealth: Payer: Self-pay

## 2024-04-06 ENCOUNTER — Ambulatory Visit: Payer: Self-pay

## 2024-04-06 DIAGNOSIS — F79 Unspecified intellectual disabilities: Secondary | ICD-10-CM

## 2024-04-06 DIAGNOSIS — G808 Other cerebral palsy: Secondary | ICD-10-CM

## 2024-04-06 DIAGNOSIS — G959 Disease of spinal cord, unspecified: Secondary | ICD-10-CM

## 2024-04-06 NOTE — Telephone Encounter (Signed)
 Patient group home manager Dennis Walsh @[phone]  called to see the  pull up pads, and hospital bed order was placed with the pharmacy since the patient's f/u on 04/14. Ms Dennis Walsh requests a call back directly                  Copied from CRM [id number]. Topic: General - Other >> Apr 06, 2024 12:55 PM Dennis Walsh S wrote: Reason for CRM: Patient group home manager Dennis Walsh @[phone]  called to see the  pull up pads, and hospital bed order was placed with the pharmacy since the patient's f/u on 04/14. Ms Dennis Walsh requests a call back directly Reason for Disposition  [1] Caller requesting NON-URGENT health information AND [2] PCP's office is the best resource  Answer Assessment - Initial Assessment Questions 1. REASON FOR CALL or QUESTION: "What is your reason for calling today?" or "How can I best help you?" or "What question do you have that I can help answer?"     Patient group home manager Dennis Walsh @[phone]  called to see the  pull up pads, and hospital bed order was placed with the pharmacy since the patient's f/u on 04/14. Ms Dennis Walsh requests a call back directly  Protocols used: Information Only Call - No Triage-A-AH

## 2024-04-09 NOTE — Telephone Encounter (Signed)
 Order has been placed for bed and pull up pads.  For pull up pads please fax after Dr. Liane Redman signs to [facility] [phone].  Ill be faxing over pull up pads order for her to sign shortly

## 2024-04-09 NOTE — Addendum Note (Signed)
 Addended by: Marylou Sobers D on: 04/09/2024 10:02 AM   Modules accepted: Orders

## 2024-04-09 NOTE — Telephone Encounter (Signed)
 Fax sent from [employer].

## 2024-04-09 NOTE — Telephone Encounter (Signed)
 Please ask Dr. Liane Redman about this please.  I saw in her note about chux pads and not sure where and how they were sent.  Also need to ask her was she ordering a hospital bed.  I do not see documentation about it.

## 2024-04-21 ENCOUNTER — Telehealth

## 2024-04-26 ENCOUNTER — Ambulatory Visit: Payer: Self-pay

## 2024-04-26 NOTE — Telephone Encounter (Signed)
 Chief Complaint: Cough Symptoms: Cough, worsened at night Frequency: 'a few days' Pertinent Negatives: Patient denies fever, shortness of breath Disposition: [] ED /[] Urgent Care (no appt availability in office) / [x] Appointment(In office/virtual)/ []  [facility]/ [] Home Care/ [] Refused Recommended Disposition /[] [place] [facility]/ [x]  Follow-up with PCP Additional Notes: Patient lives at Assisted Living Facility ([address] in [place]). Manager, Etta, called in stating patient has a cough for a few days and it is worsened at night. Nurse at facility recommended patient be evaluated in office. Patient has been given Robitussin but is not showing improvement with home care advice. Please contact Etta @ [phone] to assist with scheduling/patient needs, as Dennis Walsh is not on DPR.   Copied from CRM [id number]. Topic: Clinical - Red Word Triage >> Apr 26, 2024  1:43 PM Elle L wrote: Red Word that prompted transfer to Nurse Triage: Dennis Walsh, Manager with the patient's assisted living facility, states that the patient has a worsening cough. Reason for Disposition  [1] Continuous (nonstop) coughing interferes with work or school AND [2] no improvement using cough treatment per Care Advice  Answer Assessment - Initial Assessment Questions 1. ONSET: "When did the cough begin?"      "A few days ago" 2. SEVERITY: "How bad is the cough today?"      Worsened cough - worse at n ight 3. SPUTUM: "Describe the color of your sputum" (none, dry cough; clear, white, yellow, green)     Clear 4. HEMOPTYSIS: "Are you coughing up any blood?" If so ask: "How much?" (flecks, streaks, tablespoons, etc.)     no 5. DIFFICULTY BREATHING: "Are you having difficulty breathing?" If Yes, ask: "How bad is it?" (e.g., mild, moderate, severe)    - MILD: No SOB at rest, mild SOB with walking, speaks normally in sentences, can lie down, no retractions, pulse < 100.    - MODERATE: SOB at rest, SOB with minimal  exertion and prefers to sit, cannot lie down flat, speaks in phrases, mild retractions, audible wheezing, pulse 100-120.    - SEVERE: Very SOB at rest, speaks in single words, struggling to breathe, sitting hunched forward, retractions, pulse > 120      No noticeable trouble breathing 6. FEVER: "Do you have a fever?" If Yes, ask: "What is your temperature, how was it measured, and when did it start?"     No 7. CARDIAC HISTORY: "Do you have any history of heart disease?" (e.g., heart attack, congestive heart failure)      N/a 8. LUNG HISTORY: "Do you have any history of lung disease?"  (e.g., pulmonary embolus, asthma, emphysema)     N/a 9. PE RISK FACTORS: "Do you have a history of blood clots?" (or: recent major surgery, recent prolonged travel, bedridden)     N/a 10. OTHER SYMPTOMS: "Do you have any other symptoms?" (e.g., runny nose, wheezing, chest pain)       No  Protocols used: Cough - Acute Productive-A-AH

## 2024-04-26 NOTE — Telephone Encounter (Signed)
 I lvm to schedule

## 2024-04-27 ENCOUNTER — Encounter: Payer: Self-pay | Admitting: Internal Medicine

## 2024-04-27 ENCOUNTER — Ambulatory Visit: Admitting: Internal Medicine

## 2024-04-27 VITALS — BP 120/80 | HR 110 | Temp 98.4°F | Ht 68.0 in

## 2024-04-27 DIAGNOSIS — J069 Acute upper respiratory infection, unspecified: Secondary | ICD-10-CM | POA: Diagnosis not present

## 2024-04-27 DIAGNOSIS — Z993 Dependence on wheelchair: Secondary | ICD-10-CM

## 2024-04-27 DIAGNOSIS — G800 Spastic quadriplegic cerebral palsy: Secondary | ICD-10-CM | POA: Diagnosis not present

## 2024-04-27 DIAGNOSIS — J3489 Other specified disorders of nose and nasal sinuses: Secondary | ICD-10-CM

## 2024-04-27 DIAGNOSIS — R479 Unspecified speech disturbances: Secondary | ICD-10-CM

## 2024-04-27 DIAGNOSIS — R059 Cough, unspecified: Secondary | ICD-10-CM

## 2024-04-27 DIAGNOSIS — J00 Acute nasopharyngitis [common cold]: Secondary | ICD-10-CM

## 2024-04-27 DIAGNOSIS — H9193 Unspecified hearing loss, bilateral: Secondary | ICD-10-CM

## 2024-04-27 DIAGNOSIS — Z8719 Personal history of other diseases of the digestive system: Secondary | ICD-10-CM

## 2024-04-27 LAB — CBC WITH DIFFERENTIAL/PLATELET
Absolute Lymphocytes: 1329 {cells}/uL (ref 850–3900)
Absolute Monocytes: 642 {cells}/uL (ref 200–950)
Basophils Absolute: 53 {cells}/uL (ref 0–200)
Basophils Relative: 0.6 %
Eosinophils Absolute: 211 {cells}/uL (ref 15–500)
Eosinophils Relative: 2.4 %
HCT: 44 % (ref 38.5–50.0)
Hemoglobin: 14.9 g/dL (ref 13.2–17.1)
MCH: 31.6 pg (ref 27.0–33.0)
MCHC: 33.9 g/dL (ref 32.0–36.0)
MCV: 93.2 fL (ref 80.0–100.0)
MPV: 13.1 fL — ABNORMAL HIGH (ref 7.5–12.5)
Monocytes Relative: 7.3 %
Neutro Abs: 6565 {cells}/uL (ref 1500–7800)
Neutrophils Relative %: 74.6 %
Platelets: 177 10*3/uL (ref 140–400)
RBC: 4.72 10*6/uL (ref 4.20–5.80)
RDW: 13.5 % (ref 11.0–15.0)
Total Lymphocyte: 15.1 %
WBC: 8.8 10*3/uL (ref 3.8–10.8)

## 2024-04-27 LAB — POC COVID19/FLU A&B COMBO
Covid Antigen, POC: NEGATIVE
Influenza A Antigen, POC: NEGATIVE
Influenza B Antigen, POC: NEGATIVE

## 2024-04-27 MED ORDER — LEVOFLOXACIN 500 MG PO TABS: 500.0000 mg | ORAL_TABLET | Freq: Every day | ORAL | 0 refills | Status: AC

## 2024-04-27 NOTE — Progress Notes (Signed)
 Patient Care Team: Sylvan Evener, MD as PCP - General (Internal Medicine)  Visit Date: 04/27/24  Subjective:  Patient YN:WGNFA Dennis Gutta Jr.,Male DOB:Sep 18, 1966,57 y.o. OZH:[id number]  Chief Complaint  Patient presents with   Cough   Nasal Congestion       57 y.o.Male, accompanied by one of his caretakers, presents today for acute sick visit with Cough; Nasal Congestion. Patient has a past medical history of Cerebral Palsy, Dysarthria, and Spastic Quadriplegia. Per his caretaker today he has been coughing and sneezing over the weekend, not relieved by Robitussin. Also mentions that he has been urinating quite a bit, today this morning had gone to the bathroom 5-6 times since she got there at 8 and complained of pain while urinating once.  Past Medical History:  Diagnosis Date   Arthritis    bil knees   Cerebral palsy, quadriplegic (HCC)    Hearing loss    au   Mild mental retardation    Seborrhea    facial  No Known Allergies No family history on file. Social History   Social History Narrative   Not on file   Review of Systems  HENT:  Positive for congestion (nasal w/ sneezing). Negative for ear pain and sore throat.   Respiratory:  Positive for cough.   Genitourinary:  Positive for dysuria (complaint heard during urination once) and frequency (since Thursday; 5-6x morning of 5/13).   Objective:  Vitals: BP 120/80   Pulse (!) 110   Temp 98.4 F (36.9 C)   Ht 5\' 8"  (1.727 m)   SpO2 96%   BMI 22.50 kg/m   Physical Exam Vitals and nursing note reviewed.  Constitutional:      General: He is not in acute distress.    Appearance: Normal appearance. He is not ill-appearing.  HENT:     Head: Normocephalic and atraumatic.     Right Ear: Tympanic membrane, ear canal and external ear normal. There is impacted cerumen.     Left Ear: Tympanic membrane, ear canal and external ear normal. There is impacted cerumen.     Mouth/Throat:     Mouth: Mucous membranes are moist.      Pharynx: Oropharynx is clear. Posterior oropharyngeal erythema (slight) present. No oropharyngeal exudate.  Eyes:     Conjunctiva/sclera:     Right eye: Right conjunctiva is injected.     Left eye: Left conjunctiva is injected.  Pulmonary:     Effort: Pulmonary effort is normal.     Breath sounds: Normal breath sounds. No wheezing, rhonchi or rales.  Lymphadenopathy:     Cervical: No cervical adenopathy.  Skin:    General: Skin is warm and dry.  Neurological:     Mental Status: He is alert and oriented to person, place, and time. Mental status is at baseline.  Psychiatric:        Mood and Affect: Mood normal.        Behavior: Behavior normal.        Thought Content: Thought content normal.        Judgment: Judgment normal.     Results:  Studies Obtained And Personally Reviewed By Me: Labs:     Component Value Date/Time   NA 140 03/25/2024 1213   K 4.2 03/25/2024 1213   CL 101 03/25/2024 1213   CO2 28 03/25/2024 1213   GLUCOSE 112 (H) 03/25/2024 1213   BUN 16 03/25/2024 1213   CREATININE 0.80 03/25/2024 1213   CALCIUM  9.2 03/25/2024 1213  PROT 6.5 03/25/2024 1213   ALBUMIN 4.0 03/16/2024 1300   AST 22 03/25/2024 1213   ALT 20 03/25/2024 1213   ALKPHOS 65 03/16/2024 1300   BILITOT 0.3 03/25/2024 1213   GFRNONAA >60 03/18/2024 0445   GFRNONAA 100 04/03/2021 1015   GFRAA 116 04/03/2021 1015    Lab Results  Component Value Date   WBC 7.3 03/25/2024   HGB 13.0 (L) 03/25/2024   HCT 39.8 03/25/2024   MCV 95.7 03/25/2024   PLT 303 03/25/2024   Lab Results  Component Value Date   CHOL 127 06/17/2023   HDL 49 06/17/2023   LDLCALC 58 06/17/2023   TRIG 112 06/17/2023   CHOLHDL 2.6 06/17/2023   Lab Results  Component Value Date   TSH 2.18 03/25/2024   Results for orders placed or performed in visit on 04/27/24  POC Covid19/Flu A&B Antigen  Result Value Ref Range   Influenza A Antigen, POC Negative Negative   Influenza B Antigen, POC Negative Negative    Covid Antigen, POC Negative Negative    Assessment & Plan:   Meds ordered this encounter  Medications   levofloxacin (LEVAQUIN) 500 MG tablet    Sig: Take 1 tablet (500 mg total) by mouth daily for 7 days.    Dispense:  7 tablet    Refill:  0   Orders Placed This Encounter  Procedures   CBC with Differential/Platelet   POC Covid19/Flu A&B Antigen  Cough; Nasal Congestion; Dysuria: has been coughing and sneezing over the weekend, not relieved by Robitussin. Also has been urinating quite a bit, today this morning had gone to the bathroom 5-6 times since his caretaker arrived at 8 AM and complained of pain while urinating once. Combined Covid-19 & Flu A/B done in-office negative. Sending in 500 mg Levaquin - take 1 tablet daily with a meal for 7 days. Ordering CBC, specimen collected.      I,Emily Lagle,acting as a Neurosurgeon for Sylvan Evener, MD.,have documented all relevant documentation on the behalf of Sylvan Evener, MD,as directed by  Sylvan Evener, MD while in the presence of Sylvan Evener, MD.   ***

## 2024-04-28 ENCOUNTER — Ambulatory Visit: Payer: Self-pay

## 2024-04-28 NOTE — Telephone Encounter (Signed)
  Chief Complaint: back pain, urine dipstick positive for leukocytes and blood Symptoms: burning and frequency with urination Frequency: Friday Pertinent Negatives: Patient denies fever Disposition: [] ED /[] Urgent Care (no appt availability in office) / [x] Appointment(In office/virtual)/ []  [facility]/ [] Home Care/ [] Refused Recommended Disposition /[] [place] [facility]/ []  Follow-up with PCP Additional Notes: Caregivers asked about a doctor that makes visits - advised pt's caregiver that NT was not aware of that but would send to office to make sure. Appt made for tomorrow Reason for Disposition  Side (flank) or back pain present  Answer Assessment - Initial Assessment Questions 1. COLOR of URINE: "Describe the color of the urine."  (e.g., tea-colored, pink, red, bloody) "Do you have blood clots in your urine?" (e.g., none, pea, grape, small coin)     *No Answer* 2. ONSET: "When did the bleeding start?"      Friday  4. PAIN with URINATION: "Is there any pain with passing your urine?" If Yes, ask: "How bad is the pain?"  (Scale 1-10; or mild, moderate, severe)    - MILD: Complains slightly about urination hurting.    - MODERATE: Interferes with normal activities.      - SEVERE: Excruciating, unwilling or unable to urinate because of the pain.      moderate 5. FEVER: "Do you have a fever?" If Yes, ask: "What is your temperature, how was it measured, and when did it start?"     no 6. ASSOCIATED SYMPTOMS: "Are you passing urine more frequently than usual?"     yes 7. OTHER SYMPTOMS: "Do you have any other symptoms?" (e.g., back/flank pain, abdomen pain, vomiting)     No- Thursday burning  Protocols used: Urine - Blood In-A-AH

## 2024-04-28 NOTE — Patient Instructions (Signed)
 Patient has received new hospital bed. He appears to have an acute upper respiratory infection. Have prescribed Levaquin 500 mg daily for 7 days given lack of mobility and rick for pneumonia. Covid and Flu tests were negative in the office today. Needs to stay well hydrated. Caregiver was informed of this. Call if not improving in 24-48 hours or sooner if worse.

## 2024-04-29 ENCOUNTER — Ambulatory Visit: Payer: Self-pay | Admitting: Internal Medicine

## 2024-04-29 ENCOUNTER — Encounter: Payer: Self-pay | Admitting: Internal Medicine

## 2024-04-29 ENCOUNTER — Ambulatory Visit (INDEPENDENT_AMBULATORY_CARE_PROVIDER_SITE_OTHER): Admitting: Internal Medicine

## 2024-04-29 VITALS — BP 110/80 | HR 97 | Temp 98.2°F

## 2024-04-29 DIAGNOSIS — R194 Change in bowel habit: Secondary | ICD-10-CM

## 2024-04-29 DIAGNOSIS — R319 Hematuria, unspecified: Secondary | ICD-10-CM | POA: Diagnosis not present

## 2024-04-29 DIAGNOSIS — R82998 Other abnormal findings in urine: Secondary | ICD-10-CM | POA: Diagnosis not present

## 2024-04-29 DIAGNOSIS — R350 Frequency of micturition: Secondary | ICD-10-CM | POA: Diagnosis not present

## 2024-04-29 DIAGNOSIS — G800 Spastic quadriplegic cerebral palsy: Secondary | ICD-10-CM

## 2024-04-29 DIAGNOSIS — Z993 Dependence on wheelchair: Secondary | ICD-10-CM

## 2024-04-29 DIAGNOSIS — M549 Dorsalgia, unspecified: Secondary | ICD-10-CM | POA: Diagnosis not present

## 2024-04-29 DIAGNOSIS — H9193 Unspecified hearing loss, bilateral: Secondary | ICD-10-CM

## 2024-04-29 LAB — POCT URINALYSIS DIP (CLINITEK)
Bilirubin, UA: NEGATIVE
Blood, UA: NEGATIVE
Glucose, UA: NEGATIVE mg/dL
Leukocytes, UA: NEGATIVE
Nitrite, UA: NEGATIVE
Spec Grav, UA: 1.015 (ref 1.010–1.025)
Urobilinogen, UA: 0.2 U/dL
pH, UA: 6 (ref 5.0–8.0)

## 2024-04-29 NOTE — Progress Notes (Signed)
 Patient Care Team: Sylvan Evener, MD as PCP - General (Internal Medicine)  Visit Date: 04/29/24  Subjective:  Patient UE:AVWUJ Dennis Gutta Jr.,Male DOB:December 15, 1966,57 y.o. WJX:[id number]  Chief Complaint  Patient presents with   Hematuria  58 y.o.Male, here with caregiver and mother, presents today for Hematuria. Patient has a past medical history of Kidney Stones, Cerebral Palsy, Dysarthria, and Spastic Quadriplegia. Seen on 5/13 for Acute URI (prescribed Levaquin  500 mg) and Dysuria, caregiver was provided with a urinalysis cup and 1 UA strip to dip. CBC drawn that day was normal except MPV of 13.1. Caregiver says that his urine was dark when she dipped it, and it did seem like there was some blood on the dipstick though she admits that it may have been the specific gravity. Today there is no blood but the color is brown and is ketonic. He has started his Levaquin  and has been drinking Propel water and Gatorade for hydration, but according to his caregiver he has been complaining of back pain, which he usually only does when passing a kidney stone and then his pain is treated with Tylenol  until the stone passes. Recently seen at [facility] in on 4/21, where it was noted that he hasn't had any recent urinary issues, including kidney stones.  Caregiver also says that his bowel movements have been more loose and watery recently. He is on a regimen of Fiberlax twice daily and Miralax  twice daily, up to three times if needed.  He also mentioned some hip pain, which his mother says is likely due to him being sedentary.   Past Medical History:  Diagnosis Date   Arthritis    bil knees   Cerebral palsy, quadriplegic (HCC)    Hearing loss    au   Mild mental retardation    Seborrhea    facial  No Known Allergies No family history on file. Social History   Social History Narrative   His affect is generally pleasant. Parents are divorced - father is his guardian while mother has power of  attorney. No siblings. Single, never married - no children. Non-smoker, drinker, or drug (illicit) user. Enjoys watching TV - [employer] basketball and drawing on his computer. He has resided in a group home for many years.   Review of Systems  Gastrointestinal:        (+) Loose Stools  Genitourinary:  Positive for frequency.  Musculoskeletal:  Positive for back pain.       (+) Hip Pain  All other systems reviewed and are negative.  Objective:  Vitals: BP 110/80   Pulse 97   Temp 98.2 F (36.8 C)   SpO2 98%  Physical Exam Vitals and nursing note reviewed.  Constitutional:      General: He is not in acute distress.    Appearance: Normal appearance. He is not ill-appearing.  HENT:     Head: Normocephalic and atraumatic.  Pulmonary:     Effort: Pulmonary effort is normal.  Skin:    General: Skin is warm and dry.  Neurological:     Mental Status: He is alert and oriented to person, place, and time. Mental status is at baseline.  Psychiatric:        Mood and Affect: Mood normal.        Behavior: Behavior normal.        Thought Content: Thought content normal.        Judgment: Judgment normal.     Results:  Studies Obtained And Personally  Reviewed By Me: Labs:     Component Value Date/Time   NA 140 03/25/2024 1213   K 4.2 03/25/2024 1213   CL 101 03/25/2024 1213   CO2 28 03/25/2024 1213   GLUCOSE 112 (H) 03/25/2024 1213   BUN 16 03/25/2024 1213   CREATININE 0.80 03/25/2024 1213   CALCIUM  9.2 03/25/2024 1213   PROT 6.5 03/25/2024 1213   ALBUMIN 4.0 03/16/2024 1300   AST 22 03/25/2024 1213   ALT 20 03/25/2024 1213   ALKPHOS 65 03/16/2024 1300   BILITOT 0.3 03/25/2024 1213   GFRNONAA >60 03/18/2024 0445   GFRNONAA 100 04/03/2021 1015   GFRAA 116 04/03/2021 1015    Lab Results  Component Value Date   WBC 8.8 04/27/2024   HGB 14.9 04/27/2024   HCT 44.0 04/27/2024   MCV 93.2 04/27/2024   PLT 177 04/27/2024   Lab Results  Component Value Date   CHOL 127 06/17/2023    HDL 49 06/17/2023   LDLCALC 58 06/17/2023   TRIG 112 06/17/2023   CHOLHDL 2.6 06/17/2023   No results found for: "HGBA1C"  Lab Results  Component Value Date   TSH 2.18 03/25/2024    Lab Results  Component Value Date   PSA 0.22 06/17/2023   PSA 0.22 04/09/2022   PSA 0.16 04/03/2021     Results for orders placed or performed in visit on 04/29/24  POCT URINALYSIS DIP (CLINITEK)  Result Value Ref Range   Color, UA brown (A) yellow   Clarity, UA clear clear   Glucose, UA negative negative mg/dL   Bilirubin, UA negative negative   Ketones, POC UA small (15) (A) negative mg/dL   Spec Grav, UA 1.610 9.604 - 1.025   Blood, UA negative negative   pH, UA 6.0 5.0 - 8.0   POC PROTEIN,UA trace negative, trace   Urobilinogen, UA 0.2 0.2 or 1.0 E.U./dL   Nitrite, UA Negative Negative   Leukocytes, UA Negative Negative   Assessment & Plan:   Orders Placed This Encounter  Procedures   Urine Culture   CBC with Differential/Platelet   Basic Metabolic Panel   POCT URINALYSIS DIP (CLINITEK)   Urinary Frequency; Bascom [place] Urine; Back Pain: Hx of Kidney Stones. Seen on 5/13 for Acute URI (prescribed Levaquin  500 mg) and Dysuria, caregiver was provided with a urinalysis cup and 1 UA strip to dip - according to caretaker his urine was dark when she dipped it, and it did seem like there was some blood on the dipstick though she admits that it may have been the specific gravity. CBC drawn 5/13 was normal except MPV of 13.1. Today on UA there is no blood but the color is brown and is ketonic. Started his Levaquin  and has been drinking Propel water and Gatorade for hydration, but according to his caregiver he has been complaining of back pain, which he usually only does when passing a kidney stone and then his pain is treated with Tylenol  until the stone passes. Recently seen at [facility] in on 4/21, where it was noted that he hasn't had any recent urinary issues, including kidney stones. Urine  culture ordered. CBC & CMET ordered, specimen collected.  Addendum: Urine culture reveals no growth. BUN and Creatinine are normal. CBC shows no elevation of WBC. Kidney functions are normal.  Loose Stools: caregiver also says that his bowel movements have been more loose and watery recently. He is on a regimen of Fiberlax twice daily and Miralax  twice daily, up to  three times if needed. Recommended reducing Miralax  to once daily.   Hip Pain likely due to him being sedentary according to his mother.   Hx of Cerebral Palsy, Dysarthria, and Spastic Quadriplegia: uses a motorized wheelchair. Lives in a group home with caretakers.    I,Emily Lagle,acting as a Neurosurgeon for Sylvan Evener, MD.,have documented all relevant documentation on the behalf of Sylvan Evener, MD,as directed by  Sylvan Evener, MD while in the presence of Sylvan Evener, MD.   I, Sylvan Evener, MD, have reviewed all documentation for this visit. The documentation on 05/11/24 for the exam, diagnosis, procedures, and orders are all accurate and complete.

## 2024-04-30 ENCOUNTER — Other Ambulatory Visit: Payer: Self-pay

## 2024-04-30 LAB — BASIC METABOLIC PANEL WITH GFR
BUN: 17 mg/dL (ref 7–25)
CO2: 25 mmol/L (ref 20–32)
Calcium: 9 mg/dL (ref 8.6–10.3)
Chloride: 100 mmol/L (ref 98–110)
Creat: 0.89 mg/dL (ref 0.70–1.30)
Glucose, Bld: 107 mg/dL — ABNORMAL HIGH (ref 65–99)
Potassium: 4 mmol/L (ref 3.5–5.3)
Sodium: 138 mmol/L (ref 135–146)
eGFR: 100 mL/min/{1.73_m2} (ref >=60)

## 2024-04-30 LAB — CBC WITH DIFFERENTIAL/PLATELET
Absolute Lymphocytes: 2090 {cells}/uL (ref 850–3900)
Absolute Monocytes: 699 {cells}/uL (ref 200–950)
Basophils Absolute: 39 {cells}/uL (ref 0–200)
Basophils Relative: 0.7 %
Eosinophils Absolute: 242 {cells}/uL (ref 15–500)
Eosinophils Relative: 4.4 %
HCT: 45.6 % (ref 38.5–50.0)
Hemoglobin: 14.8 g/dL (ref 13.2–17.1)
MCH: 30.5 pg (ref 27.0–33.0)
MCHC: 32.5 g/dL (ref 32.0–36.0)
MCV: 93.8 fL (ref 80.0–100.0)
MPV: 12.8 fL — ABNORMAL HIGH (ref 7.5–12.5)
Monocytes Relative: 12.7 %
Neutro Abs: 2431 {cells}/uL (ref 1500–7800)
Neutrophils Relative %: 44.2 %
Platelets: 161 10*3/uL (ref 140–400)
RBC: 4.86 10*6/uL (ref 4.20–5.80)
RDW: 13.3 % (ref 11.0–15.0)
Total Lymphocyte: 38 %
WBC: 5.5 10*3/uL (ref 3.8–10.8)

## 2024-04-30 NOTE — Patient Outreach (Addendum)
 Complex Care Management   Visit Note  04/30/2024  Name:  Dennis Walsh. MRN: [id number] DOB: [birth year]  Situation: Referral received for Complex Care Management related to Impaired Physical Mobility.  I obtained verbal consent from Parent.  Visit completed with mother Dennis Walsh on the phone.  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; spinal fusion) Neurological Management Strategies: Routine screening Neurological Self-Management Outcome: 4 (good)  HEENT HEENT Symptoms Reported: No symptoms reported      Cardiovascular Cardiovascular Symptoms Reported: No symptoms reported Does patient have uncontrolled Hypertension?: No    Respiratory Respiratory Symptoms Reported: Dry cough Other Respiratory Symptoms: PCP is aware Respiratory Conditions: Cough Respiratory Self-Management Outcome: 4 (good)  Endocrine Patient reports the following symptoms related to hypoglycemia or hyperglycemia : No symptoms reported Is patient diabetic?: No    Gastrointestinal Gastrointestinal Symptoms Reported: No symptoms reported, Other Other Gastrointestinal Symptoms: unable to feed self Gastrointestinal Management Strategies: Medication therapy (laxative and fiber routinely) Gastrointestinal Self-Management Outcome: 4 (good) Nutrition Risk Screen (CP): No indicators present  Genitourinary Genitourinary Symptoms Reported: Other Other  Genitourinary Symptoms: hx of kidney stones, recently hematuria, PCP is aware and sent culture    Integumentary Integumentary Symptoms Reported: Skin changes Skin Conditions: Eczema Skin Management Strategies: Routine screening, Medication therapy Skin Self-Management Outcome: 4 (good)  Musculoskeletal Musculoskelatal Symptoms Reviewed: Difficulty walking Other Musculoskeletal Symptoms: Cerebral Palsy Musculoskeletal Conditions: Mobility limited, Other Other Musculoskeletal Conditions: Cerebral Palsy Musculoskeletal Management Strategies: Medical device, Routine screening Musculoskeletal Self-Management Outcome: 4 (good) Falls in the past year?: No Number of falls in past year: 1 or less Was there an injury with Fall?: No Fall Risk Category Calculator: 0 Patient Fall Risk Level: Low Fall Risk Patient at Risk for Falls Due to: Other (Comment) Fall risk Follow up: Falls evaluation completed  Psychosocial Psychosocial Symptoms Reported: No symptoms reported   Major Change/Loss/Stressor/Fears (CP): Denies Quality of Family Relationships: involved Do you feel physically threatened by others?: No      11/28/2022   12:06 PM  Depression screen PHQ 2/9  Decreased Interest 0  Down, Depressed, Hopeless 0  PHQ - 2 Score 0    There were no vitals filed for this visit.  Medications Reviewed Today     Reviewed by Kaylene Pascal, RN (Registered Nurse) on 04/30/24 at 1019  Med List Status: <None>   Medication Order Taking? Sig Documenting Provider Last Dose Status Informant  acetaminophen  (TYLENOL ) 325 MG tablet [id number] Yes Take 650 mg by mouth every 6 (six) hours as needed. [provider] Taking Active   acetaminophen  (TYLENOL ) 500 MG tablet [id number] Yes Take 1 tablet (500 mg total) by mouth every 6 (six) hours as needed. Etter Hermann., MD Taking Consider Medication Status and Discontinue (Change in therapy)   Aluminum & Magnesium Hydroxide (LIQUID ANTACID PO)  [id number] Yes Take by mouth. Use as directed as needed fro nausea/indigestion per standing order [provider] Taking Active Nursing Home Medication Administration  Guide (MAG)           Med Note (Doris Gruhn L   Fri Apr 30, 2024 10:18 AM)    Cholecalciferol (VITAMIN D ) 2000 units tablet [id number] Yes Take 1 tablet (2,000 Units total) by mouth daily. Sylvan Evener, MD Taking Active Nursing Home Medication Administration Guide (MAG)  cyclobenzaprine  (FLEXERIL ) 10 MG tablet [id number] Yes Take 1 tablet (10 mg total) by mouth 3 (three) times daily as needed for muscle spasms. Sylvan Evener, MD Taking Active Nursing Home Medication Administration Guide (MAG)           Med Note (Bandon Sherwin L   Fri Apr 30, 2024 10:15 AM)    DIMETAPP COLD/ALLERGY 1-2.5 MG/5ML syrup [id number] Yes USE AS DIRECTED AS NEEDED FOR COLD SYMPTOMS PER STANDING ORDERS Sylvan Evener, MD Taking Active Nursing Home Medication Administration Guide (MAG)           Med Note (Latreece Mochizuki L   Fri Apr 30, 2024 10:15 AM)    ferrous sulfate  (FEROSUL) 325 (65 FE) MG tablet [id number] Yes Take 1 tablet (325 mg total) by mouth every other day. Follow your iron deficiency with your PCP outpatient Etter Hermann., MD Taking Active   FIBER-LAX 625 MG tablet [id number] Yes TAKE 1 TABLET BY MOUTH TWICE DAILY AT 10AM & 5PM Sylvan Evener, MD Taking Active Nursing Home Medication Administration Guide (MAG)  fluticasone (CUTIVATE) 0.05 % cream [id number] Yes Apply 1 Application topically 2 (two) times daily as needed (scaly skin pacthes). [provider] Taking Active Nursing Home Medication Administration Guide (MAG)           Med Note (Belanna Manring L   Fri Apr 30, 2024 10:18 AM)    furosemide  (LASIX ) 40 MG tablet [id number] Yes TAKE 1 TABLET BY MOUTH EVERY DAY Baxley, Jaynie Meyers, MD Taking Active Nursing Home Medication Administration Guide (MAG)  guaifenesin (ROBITUSSIN) 100 MG/5ML syrup [id number] Yes Take by mouth. Take as  directed as needed for cough [provider] Taking Active Nursing Home Medication Administration Guide (MAG)           Med Note (Charday Capetillo L   Fri Apr 30, 2024 10:15 AM)    HYDROcodone -acetaminophen  (NORCO/VICODIN) 5-325 MG tablet [id number] Yes Take 1 tablet by mouth every 6 (six) hours as needed for moderate pain (pain score 4-6). [provider] Taking Active   hydrocortisone  2.5 % cream [id number] Yes Apply 1 Application topically 3 (three) times daily. Rectally [provider] Taking Active   hydrOXYzine (ATARAX/VISTARIL) 25 MG tablet [id number] Yes TAKE 1 TABLET BY MOUTH THREE TIMES DAILY AS NEEDED FOR ITCHING TAKE 2TABLETS (50MG ) BY MOUTH THREE TIMES DAILY AS NEEDED FOR ITCHING  Patient taking differently: Take 25-50 mg by mouth 3 (three) times daily as needed for itching.   Sylvan Evener, MD Taking Active Nursing Home Medication Administration Guide (MAG)           Med Note (Dezmin Kittelson L   Fri Apr 30, 2024 10:16 AM)    ketoconazole (NIZORAL) 2 % cream [id number] Yes APPLY TOPICALLY TO AFFECTED AREA(S) OF SEBORRHEA EVERY DAY Sylvan Evener, MD Taking Active Nursing Home Medication Administration Guide (MAG)  ketoconazole (NIZORAL) 2 % shampoo [id number] Yes SHAMPOO SCALP AS DIRECTED ONCE WEEKLY  Patient taking differently: Apply 1 Application topically once a week.   Sylvan Evener, MD Taking Active Nursing Home Medication Administration Guide (MAG)  levofloxacin (LEVAQUIN) 500 MG tablet [id number] Yes Take  1 tablet (500 mg total) by mouth daily for 7 days. Sylvan Evener, MD Taking Active   loperamide (IMODIUM A-D) 2 MG tablet [id number] Yes Take 2 mg by mouth as needed for diarrhea or loose stools. [provider] Taking Active   Magnesium Hydroxide (MILK OF MAGNESIA PO) [id number] Yes Take by mouth. Take as directed as needed for constipation per standing order [provider] Taking Active Nursing Home Medication Administration Guide (MAG)            Med Note (Karen Huhta L   Fri Apr 30, 2024 10:17 AM)    meloxicam  (MOBIC ) 15 MG tablet [id number] Yes TAKE 1 TABLET BY MOUTH EVERY DAY Baxley, Jaynie Meyers, MD Taking Active Nursing Home Medication Administration Guide (MAG)  minocycline  (MINOCIN ) 100 MG capsule [id number] Yes TAKE 1 CAPSULE BY MOUTH EVERY DAY FOR ACNE *DO NOT CRUSH* Sylvan Evener, MD Taking Active Nursing Home Medication Administration Guide (MAG)  mupirocin  ointment (BACTROBAN ) 2 % [id number] Yes APPLY TO AFFECTED AREA PRESSURE SORE TWICE DAILY AS NEEDED FOR LESSIONS ON SKIN Baxley, Jaynie Meyers, MD Taking Active Nursing Home Medication Administration Guide (MAG)           Med Note Nathen Balder, Skeeter Dukes   Fri Apr 30, 2024 10:18 AM)    Neomycin-Bacitracin-Polymyxin (TRIPLE ANTIBIOTIC) OINT [id number] Yes Apply 1 Application topically as needed (cuts/scrapes/scratches). [provider] Taking Active Nursing Home Medication Administration Guide (MAG)  omeprazole (PRILOSEC) 20 MG capsule [id number] Yes TAKE 1 CAPSULE BY MOUTH EVERY DAY *DO NOT CRUSH* Baxley, Jaynie Meyers, MD Taking Active Nursing Home Medication Administration Guide (MAG)  PARoxetine  (PAXIL ) 10 MG tablet [id number] Yes TAKE 1 TABLET BY MOUTH EVERY DAY Baxley, Jaynie Meyers, MD Taking Active Nursing Home Medication Administration Guide (MAG)  polyethylene glycol powder (GLYCOLAX /MIRALAX ) powder [id number] Yes MIX 1 CAPFUL (17 GRAMS) IN 8OZ OF BEVERAGE OF CHOICE & TAKE BY MOUTH EVERY DAY Baxley, Jaynie Meyers, MD Taking Active Nursing Home Medication Administration Guide (MAG)  rosuvastatin  (CRESTOR ) 5 MG tablet [id number] Yes TAKE 1 TABLET BY MOUTH EVERY DAY Baxley, Jaynie Meyers, MD Taking Active Nursing Home Medication Administration Guide (MAG)  SYSTANE 0.4-0.3 % SOLN [id number] Yes PLACE 1 DROP IN EACH EYE TWICE DAILY Baxley, Jaynie Meyers, MD Taking Active Nursing Home Medication Administration Guide (MAG)  tamsulosin  (FLOMAX ) 0.4 MG CAPS capsule [id number] Yes TAKE 1 CAPSULE BY MOUTH EVERY DAY AFTER SUPPER   Patient taking differently: Take 0.4 mg by mouth every evening.   Sylvan Evener, MD Taking Active Nursing Home Medication Administration Guide [facility])  Med List Note Micheal Agent, CPhT 03/17/24 1110): Harry Lindau [phone]            Recommendation:   Referral to: Patent examiner of [place]  Follow Up Plan:   Telephone follow up appointment date/time:  Friday, June 13 at 09:00 AM  Louanne Roussel RN BSN CCM [place]  [facility], [facility] Nurse Care Coordinator  Direct Dial: [phone] Website: [email or web address]

## 2024-04-30 NOTE — Progress Notes (Signed)
 Eta was notified.

## 2024-04-30 NOTE — Patient Instructions (Signed)
 Visit Information  Thank you for taking time to visit with me today. Please don't hesitate to contact me if I can be of assistance to you before our next scheduled appointment.  Our next appointment is by telephone on Friday, June 13 at 09:00 AM Please call the care guide team at [phone] if you need to cancel or reschedule your appointment.   Following is a copy of your care plan:   Goals Addressed             This Visit's Progress    VBCI RN Care Plan related to Effective Health Maintenance       Problems:  Chronic Disease Management support and education needs related to Effectiveness Health Maintenance  Goal: Over the next 30 days the Patient will demonstrate Ongoing health management independence as evidenced by patient will establish with Civil engineer, contracting of [facility] for primary care         Interventions:   Health Maintenance Interventions: Patient interviewed about adult health maintenance status including  home-based primary care  Referral sent to [facility] to evaluate for home based primary care   Patient Self-Care Activities:  Attend all scheduled provider appointments Call provider office for new concerns or questions  Take medications as prescribed    Plan:  Follow up with provider re: establishment with [facility] for primary care Telephone follow up appointment with care management team member scheduled for:  Friday, June 13 at 09:00 AM             Please call 1-800-273-TALK (toll free, 24 hour hotline) if you are experiencing a Mental Health or Behavioral Health Crisis or need someone to talk to.  The patient verbalized understanding of instructions, educational materials, and care plan provided today and DECLINED offer to receive copy of patient instructions, educational materials, and care plan.   Louanne Roussel RN BSN CCM [place]  [facility], [facility] Nurse Care Coordinator  Direct Dial:  [phone] Website: [email or web address]

## 2024-05-01 LAB — URINE CULTURE
MICRO NUMBER:: 16460248
Result:: NO GROWTH
SPECIMEN QUALITY:: ADEQUATE

## 2024-05-11 NOTE — Patient Instructions (Signed)
 CBC with diff and C-met ordered to check for occult infection and kidney functions. May reduce Miralax  to once daily if having frequent bowel movements. Monitor closely at group home and call us  if symptoms change.

## 2024-05-18 ENCOUNTER — Telehealth: Payer: Self-pay | Admitting: *Deleted

## 2024-05-18 NOTE — Patient Outreach (Signed)
  Care Management   Follow Up Note   05/18/2024 Name: Dennis Walsh. MRN: [id number] DOB: 04/23/1966   Referred by: Nancie Axe Reason for referral : Complex Care Management (RN Care Manager)   RNCM received correspondence from [facility] re: palliative referral. Upon chart review, this patient is assigned to A. Little, RNCM. Will forward correspondence accordingly.   Follow Up Plan: No further follow up required:    Grandville Lax, BSN RN [facility], [facility] RN Care Manager Direct Dial: [phone]  Fax: [phone]

## 2024-05-20 ENCOUNTER — Other Ambulatory Visit: Payer: Self-pay

## 2024-05-28 ENCOUNTER — Other Ambulatory Visit: Payer: Self-pay

## 2024-05-28 NOTE — Patient Outreach (Signed)
 Complex Care Management   Visit Note  05/28/2024  Name:  Dennis Walsh. MRN: [id number] DOB: February 22, 1966  Situation: Referral received for Complex Care Management related to Cerebral palsy, quadriplegic, Impaired Physical Mobility. I obtained verbal consent from Dennis Walsh.  Visit completed with mother Dennis Walsh on the phone.  Background:   Past Medical History:  Diagnosis Date   Arthritis    bil knees   Cerebral palsy, quadriplegic (HCC)    Hearing loss    au   Mild mental retardation    Seborrhea    facial    Assessment: Patient Reported Symptoms:  Cognitive Cognitive Status: Unable to Assess Cognitive/Intellectual Conditions Management [RPT]: Cerebral Palsy   Health Maintenance Behaviors: Annual physical exam, Social activities  Neurological Neurological Review of Symptoms: Not assessed Neurological Conditions: Cerebral palsy Neurological Management Strategies: Routine screening  HEENT HEENT Symptoms Reported: Not assessed      Cardiovascular Cardiovascular Symptoms Reported: Not assessed    Respiratory Respiratory Symptoms Reported: Not assesed    Endocrine Patient reports the following symptoms related to hypoglycemia or hyperglycemia : Not assessed    Gastrointestinal Gastrointestinal Symptoms Reported: Not assessed      Genitourinary Genitourinary Symptoms Reported: Not assessed    Integumentary Integumentary Symptoms Reported: Not assessed    Musculoskeletal Musculoskelatal Symptoms Reviewed: Difficulty walking Additional Musculoskeletal Details: Cerebral palsy, quadriplegic Musculoskeletal Conditions: Other, Mobility limited Other Musculoskeletal Conditions: Cerebral palsy, quadriplegic Musculoskeletal Management Strategies: Medical device, Routine screening Musculoskeletal Self-Management Outcome: 4 (good) Falls in the past year?: No Number of falls in past year: 1 or less Was there an injury with Fall?: No Fall Risk Category Calculator:  0 Patient Fall Risk Level: Low Fall Risk Patient at Risk for Falls Due to: Other (Comment), Impaired mobility (Cerebral palsy, quadriplegic) Fall risk Follow up: Falls evaluation completed  Psychosocial Psychosocial Symptoms Reported: Not assessed     Quality of Family Relationships: involved, supportive, helpful      11/28/2022   12:06 PM  Depression screen PHQ 2/9  Decreased Interest 0  Down, Depressed, Hopeless 0  PHQ - 2 Score 0    There were no vitals filed for this visit.  Medications Reviewed Today     Reviewed by Dennis Pascal, RN (Registered Nurse) on 05/28/24 at 1148  Med List Status: <None>   Medication Order Taking? Sig Documenting Provider Last Dose Status Informant  acetaminophen  (TYLENOL ) 325 MG tablet [id number] No Take 650 mg by mouth every 6 (six) hours as needed. Provider, Historical, Walsh Taking Active   acetaminophen  (TYLENOL ) 500 MG tablet [id number] No Take 1 tablet (500 mg total) by mouth every 6 (six) hours as needed. Dennis Hermann., Walsh Taking Active   Aluminum & Magnesium Hydroxide (LIQUID ANTACID PO) [id number] No Take by mouth. Use as directed as needed fro nausea/indigestion per standing order Provider, Historical, Walsh Taking Active Nursing Home Medication Administration Guide (MAG)           Med Note (Dennis Walsh Walsh   Fri Apr 30, 2024 10:18 AM)    Cholecalciferol (VITAMIN D ) 2000 units tablet [id number] No Take 1 tablet (2,000 Units total) by mouth daily. Dennis Walsh Taking Active Nursing Home Medication Administration Guide (MAG)  cyclobenzaprine  (FLEXERIL ) 10 MG tablet [id number] No Take 1 tablet (10 mg total) by mouth 3 (three) times daily as needed for muscle spasms. Dennis Walsh Taking Active Nursing Home Medication Administration Guide (MAG)           Med  Note (Dennis Walsh   Fri Apr 30, 2024 10:15 AM)    DIMETAPP COLD/ALLERGY 1-2.5 MG/5ML syrup [id number] No USE AS DIRECTED AS NEEDED FOR COLD SYMPTOMS PER STANDING ORDERS Walsh,  Dennis Meyers, Walsh Taking Active Nursing Home Medication Administration Guide (MAG)           Med Note (Dennis Walsh Walsh   Fri Apr 30, 2024 10:15 AM)    ferrous sulfate  (FEROSUL) 325 (65 FE) MG tablet [id number] No Take 1 tablet (325 mg total) by mouth every other day. Follow your iron deficiency with your PCP outpatient Dennis Hermann., Walsh Taking Active   FIBER-LAX 625 MG tablet [id number] No TAKE 1 TABLET BY MOUTH TWICE DAILY AT 10AM & 5PM Dennis Walsh Taking Active Nursing Home Medication Administration Guide (MAG)  fluticasone (CUTIVATE) 0.05 % cream [id number] No Apply 1 Application topically 2 (two) times daily as needed (scaly skin pacthes). Provider, Historical, Walsh Taking Active Nursing Home Medication Administration Guide (MAG)           Med Note (Airon Sahni Walsh   Fri Apr 30, 2024 10:18 AM)    furosemide  (LASIX ) 40 MG tablet [id number] No TAKE 1 TABLET BY MOUTH EVERY DAY Walsh, Dennis Meyers, Walsh Taking Active Nursing Home Medication Administration Guide (MAG)  guaifenesin (ROBITUSSIN) 100 MG/5ML syrup [id number] No Take by mouth. Take as directed as needed for cough Provider, Historical, Walsh Taking Active Nursing Home Medication Administration Guide (MAG)           Med Note (Milanya Sunderland Walsh   Fri Apr 30, 2024 10:15 AM)    HYDROcodone -acetaminophen  (NORCO/VICODIN) 5-325 MG tablet [id number] No Take 1 tablet by mouth every 6 (six) hours as needed for moderate pain (pain score 4-6). Provider, Historical, Walsh Taking Active   hydrocortisone  2.5 % cream [id number] No Apply 1 Application topically 3 (three) times daily. Rectally Provider, Historical, Walsh Taking Active   hydrOXYzine (ATARAX/VISTARIL) 25 MG tablet [id number] No TAKE 1 TABLET BY MOUTH THREE TIMES DAILY AS NEEDED FOR ITCHING TAKE 2TABLETS (50MG ) BY MOUTH THREE TIMES DAILY AS NEEDED FOR ITCHING  Patient taking differently: Take 25-50 mg by mouth 3 (three) times daily as needed for itching.   Dennis Walsh Taking Active Nursing Home Medication  Administration Guide (MAG)           Med Note (Tamika Nou Walsh   Fri Apr 30, 2024 10:16 AM)    ketoconazole (NIZORAL) 2 % cream [id number] No APPLY TOPICALLY TO AFFECTED AREA(S) OF SEBORRHEA EVERY DAY Walsh, Dennis Meyers, Walsh Taking Active Nursing Home Medication Administration Guide (MAG)  ketoconazole (NIZORAL) 2 % shampoo [id number] No SHAMPOO SCALP AS DIRECTED ONCE WEEKLY  Patient taking differently: Apply 1 Application topically once a week.   Dennis Walsh Taking Active Nursing Home Medication Administration Guide (MAG)  loperamide (IMODIUM A-D) 2 MG tablet [id number] No Take 2 mg by mouth as needed for diarrhea or loose stools. Provider, Historical, Walsh Taking Active   Magnesium Hydroxide (MILK OF MAGNESIA PO) [id number] No Take by mouth. Take as directed as needed for constipation per standing order Provider, Historical, Walsh Taking Active Nursing Home Medication Administration Guide (MAG)           Med Note (Bernadette Gores Walsh   Fri Apr 30, 2024 10:17 AM)    meloxicam  (MOBIC ) 15 MG tablet [id number] No TAKE 1 TABLET BY MOUTH EVERY DAY Walsh, Dennis Meyers, Walsh Taking Active Nursing Home Medication Administration Guide (MAG)  minocycline  (MINOCIN ) 100 MG capsule [id number] No TAKE 1 CAPSULE BY MOUTH EVERY DAY FOR ACNE *DO NOT CRUSH* Walsh, Dennis Meyers, Walsh Taking Active Nursing Home Medication Administration Guide (MAG)  mupirocin  ointment (BACTROBAN ) 2 % [id number] No APPLY TO AFFECTED AREA PRESSURE SORE TWICE DAILY AS NEEDED FOR LESSIONS ON SKIN Walsh, Dennis Meyers, Walsh Taking Active Nursing Home Medication Administration Guide (MAG)           Med Note (Jedrick Hutcherson Walsh   Fri Apr 30, 2024 10:18 AM)    Neomycin-Bacitracin-Polymyxin (TRIPLE ANTIBIOTIC) OINT [id number] No Apply 1 Application topically as needed (cuts/scrapes/scratches). Provider, Historical, Walsh Taking Active Nursing Home Medication Administration Guide (MAG)  omeprazole (PRILOSEC) 20 MG capsule [id number] No TAKE 1 CAPSULE BY MOUTH EVERY DAY *DO NOT CRUSH*  Walsh, Dennis Meyers, Walsh Taking Active Nursing Home Medication Administration Guide (MAG)  PARoxetine  (PAXIL ) 10 MG tablet [id number] No TAKE 1 TABLET BY MOUTH EVERY DAY Walsh, Dennis Meyers, Walsh Taking Active Nursing Home Medication Administration Guide (MAG)  polyethylene glycol powder (GLYCOLAX /MIRALAX ) powder [id number] No MIX 1 CAPFUL (17 GRAMS) IN 8OZ OF BEVERAGE OF CHOICE & TAKE BY MOUTH EVERY DAY Walsh, Dennis Meyers, Walsh Taking Active Nursing Home Medication Administration Guide (MAG)  rosuvastatin  (CRESTOR ) 5 MG tablet [id number] No TAKE 1 TABLET BY MOUTH EVERY DAY Walsh, Dennis Meyers, Walsh Taking Active Nursing Home Medication Administration Guide (MAG)  SYSTANE 0.4-0.3 % SOLN [id number] No PLACE 1 DROP IN EACH EYE TWICE DAILY Walsh, Dennis Meyers, Walsh Taking Active Nursing Home Medication Administration Guide (MAG)  tamsulosin  (FLOMAX ) 0.4 MG CAPS capsule [id number] No TAKE 1 CAPSULE BY MOUTH EVERY DAY AFTER SUPPER  Patient taking differently: Take 0.4 mg by mouth every evening.   Dennis Walsh Taking Active Nursing Home Medication Administration Guide [facility])  Med List Note Micheal Agent, CPhT 03/17/24 1110): Harry Lindau [phone]            Recommendation:   PCP Follow-up with [facility] as directed   Follow Up Plan:   Closing From:  Complex Care Management  Louanne Roussel RN BSN CCM [facility]  [facility], [facility] Nurse Care Coordinator  Direct Dial: [phone] Website: [email or web address]

## 2024-05-28 NOTE — Patient Instructions (Signed)
 Visit Information  Thank you for taking time to visit with me today.   Please call the care guide team at [phone] if you need to cancel, schedule, or reschedule an appointment.   Please call 1-800-273-TALK (toll free, 24 hour hotline) if you are experiencing a Mental Health or Behavioral Health Crisis or need someone to talk to.  Louanne Roussel RN BSN CCM [place]  [facility], [facility] Health Nurse Care Coordinator  Direct Dial: [phone] Website: [email or web address]

## 2024-06-01 ENCOUNTER — Telehealth: Payer: Self-pay

## 2024-06-01 NOTE — Patient Outreach (Signed)
 Received an inbound call from [employer] with Harry Lindau of [place], requesting assistance with contacting patient's new provider, [facility]. Placed a joint outbound call to [employer], care coordinator with [facility]. Jorge Newcomer confirmed this patient is newly established with their primary care program. She provided Eta with the direct number for this department [phone]. Shana scheduled patient's AWV with Eta as well as she updated his upcoming needed vaccinations. Discussed the patient's family should contact his Medicaid plan to provide the NPI [id number] for [facility] so there are no billing issues with Medicaid. Placed an unsuccessful call to patient's mother [name], left a HIPAA approved voice message with my direct number requesting a return call.   Louanne Roussel RN BSN CCM [place]  [facility] Nurse Care Coordinator  Direct Dial: [phone] Website: [email or web address]

## 2024-06-08 ENCOUNTER — Telehealth: Payer: Self-pay | Admitting: Internal Medicine

## 2024-06-08 ENCOUNTER — Telehealth: Payer: Self-pay

## 2024-06-08 NOTE — Telephone Encounter (Signed)
 Spoke with his mother, Tilton Lanius and told her he had been accepted by [facility] and change had been made in EPIC. She has questions about this. Says they have been referred to [facility]. She is not sure what that is. She has left message for Clayborne Ly, R.N. for advice. Says he needs Medicare wellness visit. MJB, MD

## 2024-06-08 NOTE — Telephone Encounter (Signed)
 CRM received:   Pt mom is calling let Dr. Perri know that they have not been able to get the pt scheduled because [facility] and [facility] is not in contract. Pt mom is interested in being connected with Complex Care manager Angel Little. However she has not any success with connecting with [facility]. I have also assisted pt with trying to call Clayborne as per the document on 06/02/2023 we were not successful.   Please advise.

## 2024-06-11 ENCOUNTER — Other Ambulatory Visit

## 2024-06-14 NOTE — Patient Outreach (Signed)
 Received a staff message from Dr. Ronal Hailstone, patient's prior PCP, requesting further assistance to patient's mother Tilton Lanius regarding patient's establishment with [facility]. Placed an outbound call to [place], Care Coordinator with [facility] Primary Care department, left a detailed message requesting a return call to this RN and provided my direct contact number.   Clayborne Ly RN BSN CCM [place]  [facility], [facility] Nurse Care Coordinator  Direct Dial: [phone] Website: [email or web address]

## 2024-06-14 NOTE — Patient Outreach (Signed)
 Placed an outbound call to Alberta Sink, Clinical Director with [employer] [facility] [employer] primary home based program. Discussed patient's eligibility with this provider and Candace confirmed [facility] is in network with patient's Medicare plan which is his primary insurer.Candace confirmed with the billing department that [facility], patient's secondary insurer, is not in network with [facility], therefore the coinsurance will be charged to the patient. His first bill indicates patient's owes $26.44 but this may vary depending on the services rendered. Placed an outbound call to patient's mother Tilton Lanius as well as Eta, Care Coordinator with [facility] to make them aware. Discussed patient's mother will provide a copy of the healthcare power of attorney to [facility] at patient's next scheduled home visit on 06/23/24. Sent Dr. Ronal Hailstone a message to update her on the stated above regarding patient's eligibility with [facility].  Clayborne Ly RN BSN CCM [place]  [facility], [facility] Nurse Care Coordinator  Direct Dial: [phone] Website: [email or web address]

## 2024-06-25 ENCOUNTER — Ambulatory Visit: Admitting: Internal Medicine
